# Patient Record
Sex: Female | Born: 1975 | Race: Black or African American | Hispanic: No | Marital: Single | Smoking: Never smoker
Health system: Southern US, Community
[De-identification: ages and names within clinical notes are randomized; demographics above are authoritative.]

## PROBLEM LIST (undated history)

## (undated) DIAGNOSIS — I10 Essential (primary) hypertension: Secondary | ICD-10-CM

## (undated) HISTORY — PX: TUBAL LIGATION: SHX77

---

## 2006-01-30 ENCOUNTER — Emergency Department (HOSPITAL_COMMUNITY): Admission: EM | Admit: 2006-01-30 | Discharge: 2006-01-30 | Payer: Self-pay | Admitting: Family Medicine

## 2006-03-14 ENCOUNTER — Emergency Department (HOSPITAL_COMMUNITY): Admission: EM | Admit: 2006-03-14 | Discharge: 2006-03-14 | Payer: Self-pay | Admitting: Emergency Medicine

## 2006-07-01 ENCOUNTER — Emergency Department (HOSPITAL_COMMUNITY): Admission: EM | Admit: 2006-07-01 | Discharge: 2006-07-01 | Payer: Self-pay | Admitting: Family Medicine

## 2006-12-02 ENCOUNTER — Emergency Department (HOSPITAL_COMMUNITY): Admission: EM | Admit: 2006-12-02 | Discharge: 2006-12-02 | Payer: Self-pay | Admitting: Family Medicine

## 2007-09-20 ENCOUNTER — Emergency Department (HOSPITAL_COMMUNITY): Admission: EM | Admit: 2007-09-20 | Discharge: 2007-09-20 | Payer: Self-pay | Admitting: Family Medicine

## 2008-03-14 ENCOUNTER — Emergency Department (HOSPITAL_COMMUNITY): Admission: EM | Admit: 2008-03-14 | Discharge: 2008-03-14 | Payer: Self-pay | Admitting: Family Medicine

## 2009-03-27 ENCOUNTER — Emergency Department (HOSPITAL_COMMUNITY): Admission: EM | Admit: 2009-03-27 | Discharge: 2009-03-27 | Payer: Self-pay | Admitting: Emergency Medicine

## 2010-07-10 ENCOUNTER — Emergency Department (HOSPITAL_COMMUNITY): Admission: EM | Admit: 2010-07-10 | Discharge: 2010-07-10 | Payer: Self-pay | Admitting: Family Medicine

## 2011-12-06 ENCOUNTER — Encounter (HOSPITAL_COMMUNITY): Payer: Self-pay | Admitting: *Deleted

## 2011-12-06 ENCOUNTER — Emergency Department (HOSPITAL_COMMUNITY)
Admission: EM | Admit: 2011-12-06 | Discharge: 2011-12-06 | Disposition: A | Discharge status: Home / Self Care | Payer: Self-pay | Source: Home / Self Care | Attending: Emergency Medicine | Admitting: Emergency Medicine

## 2011-12-06 DIAGNOSIS — J329 Chronic sinusitis, unspecified: Secondary | ICD-10-CM

## 2011-12-06 HISTORY — DX: Essential (primary) hypertension: I10

## 2011-12-06 MED ORDER — GUAIFENESIN ER 600 MG PO TB12: 1200.0000 mg | ORAL_TABLET | Freq: Two times a day (BID) | ORAL | Status: AC

## 2011-12-06 MED ORDER — IBUPROFEN 600 MG PO TABS: 600.0000 mg | ORAL_TABLET | Freq: Four times a day (QID) | ORAL | Status: AC | PRN

## 2011-12-06 MED ORDER — FLUTICASONE PROPIONATE 50 MCG/ACT NA SUSP: 2.0000 | Freq: Every day | NASAL | Status: AC

## 2011-12-06 MED ORDER — AMOXICILLIN-POT CLAVULANATE 875-125 MG PO TABS: 1.0000 | ORAL_TABLET | Freq: Two times a day (BID) | ORAL | Status: AC

## 2011-12-06 NOTE — ED Notes (Signed)
Pt is here with complaints of 1 week history of left sided facial pain/pressure and ear ache.  Denies additional complaints.

## 2011-12-06 NOTE — Discharge Instructions (Signed)
Take the medication as written. Return if you get worse, have a fever >100.4, or for any concerns. You may take 600 mg of motrin with 1 gram of tylenol up to 4 times a day as needed for pain. This is an effective combination for pain.  Most sinus infections are viral and do not need antibiotics unless you have a high fever, have had this for 10 day, or you get better and then get sick again. Use a neti pot or the NeilMed sinus rinse as often as you want to to reduce nasal congestion. Follow the directions on the box.  Sinusitis Sinuses are air pockets within the bones of your face. The growth of bacteria within a sinus leads to infection. The infection prevents the sinuses from draining. This infection is called sinusitis. SYMPTOMS  There will be different areas of pain depending on which sinuses have become infected.  The maxillary sinuses often produce pain beneath the eyes.   Frontal sinusitis may cause pain in the middle of the forehead and above the eyes.  Other problems (symptoms) include:  Toothaches.   Colored, pus-like (purulent) drainage from the nose.   Swelling, warmth, and tenderness over the sinus areas may be signs of infection.  TREATMENT  Sinusitis is most often determined by an exam.X-rays may be taken. If x-rays have been taken, make sure you obtain your results or find out how you are to obtain them. Your caregiver may give you medications (antibiotics). These are medications that will help kill the bacteria causing the infection. You may also be given a medication (decongestant) that helps to reduce sinus swelling.  HOME CARE INSTRUCTIONS   Only take over-the-counter or prescription medicines for pain, discomfort, or fever as directed by your caregiver.   Drink extra fluids. Fluids help thin the mucus so your sinuses can drain more easily.   Applying either moist heat or ice packs to the sinus areas may help relieve discomfort.   Use saline nasal sprays to help moisten  your sinuses. The sprays can be found at your local drugstore.  SEEK IMMEDIATE MEDICAL CARE IF:  You have a fever.   You have increasing pain, severe headaches, or toothache.   You have nausea, vomiting, or drowsiness.   You develop unusual swelling around the face or trouble seeing.  MAKE SURE YOU:   Understand these instructions.   Will watch your condition.   Will get help right away if you are not doing well or get worse.  Document Released: 10/04/2005 Document Revised: 06/16/2011 Document Reviewed: 05/03/2007 [employer] Patient Information 2012 [employer]. 

## 2011-12-06 NOTE — ED Provider Notes (Signed)
History     CSN: [id number]  Arrival date & time 12/06/11  0806   First MD Initiated Contact with Patient 12/06/11 [phone]      Chief Complaint  Patient presents with  . Facial Pain    (Consider location/radiation/quality/duration/timing/severity/associated sxs/prior treatment) HPI Comments: Pt with nasal congestion,  nonproductive cough, left frontal sinus pain/pressure worse with bending forward/lying down x 7 days, ear fullness, left ear pain. Patient states that her teeth hurt.. No fevers, ST,  N/V, other HA, bodyaches, purulent nasal d/c. Taking unknown medication w/o relief. No known sick contacts.     Patient is a 36 y.o. female presenting with sinusitis. The history is provided by the patient. No language interpreter was used.  Sinusitis  This is a new problem. The current episode started more than 1 week ago. There has been no fever. The pain has been constant since onset. Associated symptoms include ear pain and sinus pressure. Pertinent negatives include no cough. She has tried other medications for the symptoms. The treatment provided no relief.    Past Medical History  Diagnosis Date  . Hypertension     History reviewed. No pertinent past surgical history.  History reviewed. No pertinent family history.  History  Substance Use Topics  . Smoking status: Never Smoker   . Smokeless tobacco: Not on file  . Alcohol Use: Yes    OB History    Grav Para Term Preterm Abortions TAB SAB Ect Mult Living                  Review of Systems  Constitutional: Negative for fever.  HENT: Positive for ear pain and sinus pressure.   Respiratory: Negative for cough.   Gastrointestinal: Negative for nausea and vomiting.  Neurological: Positive for headaches.    Allergies  Review of patient's allergies indicates no known allergies.  Home Medications   Current Outpatient Rx  Name Route Sig Dispense Refill  . BENAZEPRIL HCL 10 MG PO TABS Oral Take 10 mg by mouth daily.      . AMOXICILLIN-POT CLAVULANATE 875-125 MG PO TABS Oral Take 1 tablet by mouth 2 (two) times daily. X 7 days 14 tablet 0  . FLUTICASONE PROPIONATE 50 MCG/ACT NA SUSP Nasal Place 2 sprays into the nose daily. 16 g 0  . GUAIFENESIN ER 600 MG PO TB12 Oral Take 2 tablets (1,200 mg total) by mouth 2 (two) times daily. 28 tablet 0  . IBUPROFEN 600 MG PO TABS Oral Take 1 tablet (600 mg total) by mouth every 6 (six) hours as needed for pain. 30 tablet 0    BP 139/104  Pulse 78  Temp(Src) 98.4 F (36.9 C) (Oral)  Resp 20  SpO2 99%  LMP 11/10/2011  Physical Exam  Nursing note and vitals reviewed. Constitutional: She is oriented to person, place, and time. She appears well-developed and well-nourished. No distress.  HENT:  Head: Normocephalic and atraumatic.  Right Ear: Tympanic membrane normal.  Left Ear: Tympanic membrane normal.  Nose: Mucosal edema and rhinorrhea present. Right sinus exhibits no maxillary sinus tenderness and no frontal sinus tenderness. Left sinus exhibits maxillary sinus tenderness. Left sinus exhibits no frontal sinus tenderness.  Mouth/Throat: Uvula is midline, oropharynx is clear and moist and mucous membranes are normal.  Eyes: Conjunctivae and EOM are normal. Pupils are equal, round, and reactive to light.  Neck: Normal range of motion.  Cardiovascular: Normal rate.   Pulmonary/Chest: Effort normal.  Abdominal: She exhibits no distension.  Musculoskeletal: Normal range  of motion.  Lymphadenopathy:    She has no cervical adenopathy.  Neurological: She is alert and oriented to person, place, and time.  Skin: Skin is warm and dry.  Psychiatric: She has a normal mood and affect. Her behavior is normal. Judgment and thought content normal.    ED Course  Procedures (including critical care time)  Labs Reviewed - No data to display No results found.   1. Sinusitis       MDM  No fevers >102, has had sx for < 10 days, no h/o double sickening. No historical or  objective evidence of bacterial infection. Will send home antibiotics, however, advise patient to wait another 3 days to fill it. Will start flonase, mucinex, increase fluids, nasal saline irrigation,  tylenol/motrin prn pain. Discussed MDM and plan with pt. Pt agrees with plan and will f/u with PMD prn.   Blood pressure noted. asxatic here. Will have patient monitor this at home.  Luiz Blare, MD 12/06/11 [phone]

## 2012-05-21 ENCOUNTER — Encounter (HOSPITAL_COMMUNITY): Payer: Self-pay | Admitting: Emergency Medicine

## 2012-05-21 ENCOUNTER — Emergency Department (HOSPITAL_COMMUNITY)
Admission: EM | Admit: 2012-05-21 | Discharge: 2012-05-21 | Disposition: A | Discharge status: Home / Self Care | Payer: Medicaid Other | Source: Home / Self Care | Attending: Emergency Medicine | Admitting: Emergency Medicine

## 2012-05-21 DIAGNOSIS — J019 Acute sinusitis, unspecified: Secondary | ICD-10-CM

## 2012-05-21 DIAGNOSIS — J309 Allergic rhinitis, unspecified: Secondary | ICD-10-CM

## 2012-05-21 MED ORDER — AMOXICILLIN-POT CLAVULANATE 875-125 MG PO TABS: 1.0000 | ORAL_TABLET | Freq: Two times a day (BID) | ORAL | Status: AC

## 2012-05-21 MED ORDER — FLUTICASONE PROPIONATE 50 MCG/ACT NA SUSP: 2.0000 | Freq: Every day | NASAL | Status: AC

## 2012-05-21 NOTE — ED Notes (Signed)
Pt has sinus pressure since 7-25

## 2012-05-21 NOTE — ED Provider Notes (Signed)
Chief Complaint  Patient presents with  . Facial Pain    History of Present Illness:   The patient is a 36 year old female who's had an 11 day history of sinus pressure worse on the left than the right, nasal congestion with clear drainage, postnasal drip, headache, sneezing, itchy nose, itchy watery eyes. She also had some postnasal drip and sore throat but denies any hoarseness. She had a slight cough productive of clear sputum. She history of allergies. She tried some over-the-counter medication without much relief. She has a history of high blood pressure.  Review of Systems:  Other than noted above, the patient denies any of the following symptoms. Systemic:  No fever, chills, sweats, fatigue, myalgias, headache, or anorexia. Eye:  No redness, pain or drainage. ENT:  No earache, ear congestion, nasal congestion, sneezing, rhinorrhea, sinus pressure, sinus pain, post nasal drip, or sore throat. Lungs:  No cough, sputum production, wheezing, shortness of breath, or chest pain. GI:  No abdominal pain, nausea, vomiting, or diarrhea. Skin:  No rash or itching.  PMFSH:  Past medical history, family history, social history, meds, and allergies were reviewed.  Physical Exam:   Vital signs:  BP 164/102  Pulse 67  Temp 98.8 F (37.1 C) (Oral)  Resp 17  SpO2 99% General:  Alert, in no distress. Eye:  No conjunctival injection or drainage. Lids were normal. ENT:  TMs and canals were normal, without erythema or inflammation.  Nasal mucosa was congested, without drainage.  Mucous membranes were moist.  Pharynx was clear, without exudate or drainage.  There were no oral ulcerations or lesions. Neck:  Supple, no adenopathy, tenderness or mass. Lungs:  No respiratory distress.  Lungs were clear to auscultation, without wheezes, rales or rhonchi.  Breath sounds were clear and equal bilaterally. Lungs were resonant to percussion.  No egophony. Heart:  Regular rhythm, without gallops, murmers or  rubs. Skin:  Clear, warm, and dry, without rash or lesions.  Assessment:  The primary encounter diagnosis was Allergic rhinitis. A diagnosis of Acute sinusitis was also pertinent to this visit.  Plan:   1.  The following meds were prescribed:   New Prescriptions   AMOXICILLIN-CLAVULANATE (AUGMENTIN) 875-125 MG PER TABLET    Take 1 tablet by mouth 2 (two) times daily.   FLUTICASONE (FLONASE) 50 MCG/ACT NASAL SPRAY    Place 2 sprays into the nose daily.   2.  The patient was instructed in symptomatic care and handouts were given. 3.  The patient was told to return if becoming worse in any way, if no better in 3 or 4 days, and given some red flag symptoms that would indicate earlier return.   Reuben Likes, MD 05/21/12 [phone]

## 2019-06-12 DIAGNOSIS — R7303 Prediabetes: Secondary | ICD-10-CM | POA: Insufficient documentation

## 2020-03-29 ENCOUNTER — Inpatient Hospital Stay (HOSPITAL_COMMUNITY): Payer: BC Managed Care – PPO

## 2020-03-29 ENCOUNTER — Emergency Department (HOSPITAL_COMMUNITY): Payer: BC Managed Care – PPO

## 2020-03-29 ENCOUNTER — Other Ambulatory Visit: Payer: Self-pay

## 2020-03-29 ENCOUNTER — Encounter (HOSPITAL_COMMUNITY): Payer: Self-pay

## 2020-03-29 ENCOUNTER — Inpatient Hospital Stay (HOSPITAL_COMMUNITY)
Admission: EM | Admit: 2020-03-29 | Discharge: 2020-03-31 | DRG: 812 | Disposition: A | Discharge status: Home / Self Care | Payer: BC Managed Care – PPO | Attending: Internal Medicine | Admitting: Internal Medicine

## 2020-03-29 DIAGNOSIS — N83201 Unspecified ovarian cyst, right side: Secondary | ICD-10-CM | POA: Diagnosis present

## 2020-03-29 DIAGNOSIS — D62 Acute posthemorrhagic anemia: Principal | ICD-10-CM | POA: Diagnosis present

## 2020-03-29 DIAGNOSIS — E871 Hypo-osmolality and hyponatremia: Secondary | ICD-10-CM | POA: Diagnosis present

## 2020-03-29 DIAGNOSIS — D509 Iron deficiency anemia, unspecified: Secondary | ICD-10-CM | POA: Diagnosis present

## 2020-03-29 DIAGNOSIS — Z20822 Contact with and (suspected) exposure to covid-19: Secondary | ICD-10-CM | POA: Diagnosis present

## 2020-03-29 DIAGNOSIS — N838 Other noninflammatory disorders of ovary, fallopian tube and broad ligament: Secondary | ICD-10-CM | POA: Diagnosis present

## 2020-03-29 DIAGNOSIS — D75839 Thrombocytosis, unspecified: Secondary | ICD-10-CM | POA: Diagnosis present

## 2020-03-29 DIAGNOSIS — I10 Essential (primary) hypertension: Secondary | ICD-10-CM | POA: Diagnosis present

## 2020-03-29 DIAGNOSIS — R195 Other fecal abnormalities: Secondary | ICD-10-CM | POA: Diagnosis present

## 2020-03-29 DIAGNOSIS — R8271 Bacteriuria: Secondary | ICD-10-CM | POA: Diagnosis present

## 2020-03-29 DIAGNOSIS — K922 Gastrointestinal hemorrhage, unspecified: Secondary | ICD-10-CM | POA: Diagnosis present

## 2020-03-29 DIAGNOSIS — D72829 Elevated white blood cell count, unspecified: Secondary | ICD-10-CM | POA: Diagnosis present

## 2020-03-29 DIAGNOSIS — R000 Tachycardia, unspecified: Secondary | ICD-10-CM | POA: Diagnosis present

## 2020-03-29 DIAGNOSIS — R1900 Intra-abdominal and pelvic swelling, mass and lump, unspecified site: Secondary | ICD-10-CM | POA: Diagnosis not present

## 2020-03-29 DIAGNOSIS — Z6836 Body mass index (BMI) 36.0-36.9, adult: Secondary | ICD-10-CM | POA: Diagnosis not present

## 2020-03-29 DIAGNOSIS — E872 Acidosis, unspecified: Secondary | ICD-10-CM | POA: Diagnosis present

## 2020-03-29 DIAGNOSIS — E878 Other disorders of electrolyte and fluid balance, not elsewhere classified: Secondary | ICD-10-CM | POA: Diagnosis present

## 2020-03-29 DIAGNOSIS — E876 Hypokalemia: Secondary | ICD-10-CM | POA: Diagnosis present

## 2020-03-29 DIAGNOSIS — R531 Weakness: Secondary | ICD-10-CM

## 2020-03-29 DIAGNOSIS — D649 Anemia, unspecified: Secondary | ICD-10-CM | POA: Diagnosis present

## 2020-03-29 DIAGNOSIS — N920 Excessive and frequent menstruation with regular cycle: Secondary | ICD-10-CM | POA: Diagnosis present

## 2020-03-29 LAB — CBC WITH DIFFERENTIAL/PLATELET
Abs Immature Granulocytes: 0.27 10*3/uL — ABNORMAL HIGH (ref 0.00–0.07)
Basophils Absolute: 0 10*3/uL (ref 0.0–0.1)
Basophils Relative: 0 %
Eosinophils Absolute: 0 10*3/uL (ref 0.0–0.5)
Eosinophils Relative: 0 %
HCT: 17.1 % — ABNORMAL LOW (ref 36.0–46.0)
Hemoglobin: 4.6 g/dL — CL (ref 12.0–15.0)
Immature Granulocytes: 1 %
Lymphocytes Relative: 10 %
Lymphs Abs: 2.7 10*3/uL (ref 0.7–4.0)
MCH: 15.6 pg — ABNORMAL LOW (ref 26.0–34.0)
MCHC: 26.9 g/dL — ABNORMAL LOW (ref 30.0–36.0)
MCV: 58.2 fL — ABNORMAL LOW (ref 80.0–100.0)
Monocytes Absolute: 1.8 10*3/uL — ABNORMAL HIGH (ref 0.1–1.0)
Monocytes Relative: 7 %
Neutro Abs: 21.2 10*3/uL — ABNORMAL HIGH (ref 1.7–7.7)
Neutrophils Relative %: 82 %
Platelets: 886 10*3/uL — ABNORMAL HIGH (ref 150–400)
RBC: 2.94 MIL/uL — ABNORMAL LOW (ref 3.87–5.11)
RDW: 22.3 % — ABNORMAL HIGH (ref 11.5–15.5)
WBC: 26 10*3/uL — ABNORMAL HIGH (ref 4.0–10.5)
nRBC: 0.5 % — ABNORMAL HIGH (ref 0.0–0.2)

## 2020-03-29 LAB — I-STAT VENOUS BLOOD GAS, ED
Acid-Base Excess: 4 mmol/L — ABNORMAL HIGH (ref 0.0–2.0)
Acid-Base Excess: 5 mmol/L — ABNORMAL HIGH (ref 0.0–2.0)
Bicarbonate: 25 mmol/L (ref 20.0–28.0)
Bicarbonate: 25.5 mmol/L (ref 20.0–28.0)
Calcium, Ion: 0.98 mmol/L — ABNORMAL LOW (ref 1.15–1.40)
Calcium, Ion: 1.03 mmol/L — ABNORMAL LOW (ref 1.15–1.40)
HCT: 17 % — ABNORMAL LOW (ref 36.0–46.0)
HCT: 19 % — ABNORMAL LOW (ref 36.0–46.0)
Hemoglobin: 5.8 g/dL — CL (ref 12.0–15.0)
Hemoglobin: 6.5 g/dL — CL (ref 12.0–15.0)
O2 Saturation: 78 %
O2 Saturation: 78 %
Potassium: 3.2 mmol/L — ABNORMAL LOW (ref 3.5–5.1)
Potassium: 3.4 mmol/L — ABNORMAL LOW (ref 3.5–5.1)
Sodium: 136 mmol/L (ref 135–145)
Sodium: 137 mmol/L (ref 135–145)
TCO2: 26 mmol/L (ref 22–32)
TCO2: 26 mmol/L (ref 22–32)
pCO2, Ven: 19.4 mmHg — CL (ref 44.0–60.0)
pCO2, Ven: 23.4 mmHg — ABNORMAL LOW (ref 44.0–60.0)
pH, Ven: 7.645 (ref 7.250–7.430)
pH, Ven: 7.72 (ref 7.250–7.430)
pO2, Ven: 30 mmHg — CL (ref 32.0–45.0)
pO2, Ven: 33 mmHg (ref 32.0–45.0)

## 2020-03-29 LAB — FERRITIN: Ferritin: 20 ng/mL (ref 11–307)

## 2020-03-29 LAB — URINALYSIS, ROUTINE W REFLEX MICROSCOPIC
Bilirubin Urine: NEGATIVE
Glucose, UA: NEGATIVE mg/dL
Ketones, ur: NEGATIVE mg/dL
Nitrite: NEGATIVE
Protein, ur: 100 mg/dL — AB
Specific Gravity, Urine: 1.02 (ref 1.005–1.030)
pH: 5.5 (ref 5.0–8.0)

## 2020-03-29 LAB — CBC
HCT: 21.9 % — ABNORMAL LOW (ref 36.0–46.0)
Hemoglobin: 6.6 g/dL — CL (ref 12.0–15.0)
MCH: 20 pg — ABNORMAL LOW (ref 26.0–34.0)
MCHC: 30.1 g/dL (ref 30.0–36.0)
MCV: 66.4 fL — ABNORMAL LOW (ref 80.0–100.0)
Platelets: 332 10*3/uL (ref 150–400)
RBC: 3.3 MIL/uL — ABNORMAL LOW (ref 3.87–5.11)
RDW: 31.5 % — ABNORMAL HIGH (ref 11.5–15.5)
WBC: 20.2 10*3/uL — ABNORMAL HIGH (ref 4.0–10.5)
nRBC: 0.5 % — ABNORMAL HIGH (ref 0.0–0.2)

## 2020-03-29 LAB — URINALYSIS, MICROSCOPIC (REFLEX)

## 2020-03-29 LAB — MAGNESIUM: Magnesium: 1.4 mg/dL — ABNORMAL LOW (ref 1.7–2.4)

## 2020-03-29 LAB — HEPATIC FUNCTION PANEL
ALT: 9 U/L (ref 0–44)
AST: 12 U/L — ABNORMAL LOW (ref 15–41)
Albumin: 1.9 g/dL — ABNORMAL LOW (ref 3.5–5.0)
Alkaline Phosphatase: 78 U/L (ref 38–126)
Bilirubin, Direct: <0.1 mg/dL (ref 0.0–0.2)
Total Bilirubin: 0.6 mg/dL (ref 0.3–1.2)
Total Protein: 7.7 g/dL (ref 6.5–8.1)

## 2020-03-29 LAB — VITAMIN B12: Vitamin B-12: 463 pg/mL (ref 180–914)

## 2020-03-29 LAB — RAPID URINE DRUG SCREEN, HOSP PERFORMED
Amphetamines: NOT DETECTED
Barbiturates: NOT DETECTED
Benzodiazepines: NOT DETECTED
Cocaine: NOT DETECTED
Opiates: NOT DETECTED
Tetrahydrocannabinol: NOT DETECTED

## 2020-03-29 LAB — IRON AND TIBC
Iron: 6 ug/dL — ABNORMAL LOW (ref 28–170)
Saturation Ratios: 3 % — ABNORMAL LOW (ref 10.4–31.8)
TIBC: 172 ug/dL — ABNORMAL LOW (ref 250–450)
UIBC: 166 ug/dL

## 2020-03-29 LAB — CBG MONITORING, ED: Glucose-Capillary: 132 mg/dL — ABNORMAL HIGH (ref 70–99)

## 2020-03-29 LAB — ABO/RH: ABO/RH(D): O POS

## 2020-03-29 LAB — RETICULOCYTES
Immature Retic Fract: 28.7 % — ABNORMAL HIGH (ref 2.3–15.9)
RBC.: 2.82 MIL/uL — ABNORMAL LOW (ref 3.87–5.11)
Retic Count, Absolute: 49.1 10*3/uL (ref 19.0–186.0)
Retic Ct Pct: 1.7 % (ref 0.4–3.1)

## 2020-03-29 LAB — PROTIME-INR
INR: 1.4 — ABNORMAL HIGH (ref 0.8–1.2)
Prothrombin Time: 16.3 seconds — ABNORMAL HIGH (ref 11.4–15.2)

## 2020-03-29 LAB — BASIC METABOLIC PANEL
Anion gap: 15 (ref 5–15)
BUN: 6 mg/dL (ref 6–20)
CO2: 24 mmol/L (ref 22–32)
Calcium: 8.2 mg/dL — ABNORMAL LOW (ref 8.9–10.3)
Chloride: 95 mmol/L — ABNORMAL LOW (ref 98–111)
Creatinine, Ser: 1 mg/dL (ref 0.44–1.00)
GFR calc Af Amer: >60 mL/min (ref >60)
GFR calc non Af Amer: >60 mL/min (ref >60)
Glucose, Bld: 183 mg/dL — ABNORMAL HIGH (ref 70–99)
Potassium: 2.9 mmol/L — ABNORMAL LOW (ref 3.5–5.1)
Sodium: 134 mmol/L — ABNORMAL LOW (ref 135–145)

## 2020-03-29 LAB — SARS CORONAVIRUS 2 BY RT PCR (HOSPITAL ORDER, PERFORMED IN ~~LOC~~ HOSPITAL LAB): SARS Coronavirus 2: NEGATIVE

## 2020-03-29 LAB — POC OCCULT BLOOD, ED: Fecal Occult Bld: POSITIVE — AB

## 2020-03-29 LAB — APTT: aPTT: 35 seconds (ref 24–36)

## 2020-03-29 LAB — LACTIC ACID, PLASMA
Lactic Acid, Venous: 2.1 mmol/L (ref 0.5–1.9)
Lactic Acid, Venous: 1.4 mmol/L (ref 0.5–1.9)

## 2020-03-29 LAB — I-STAT BETA HCG BLOOD, ED (MC, WL, AP ONLY): I-stat hCG, quantitative: <5 m[IU]/mL (ref <5)

## 2020-03-29 LAB — FOLATE: Folate: 13 ng/mL (ref >5.9)

## 2020-03-29 LAB — PREPARE RBC (CROSSMATCH)

## 2020-03-29 MED ORDER — ONDANSETRON HCL 4 MG PO TABS
4.0000 mg | ORAL_TABLET | Freq: Once | ORAL | Status: DC
  Filled 2020-03-29: qty 1

## 2020-03-29 MED ORDER — MAGNESIUM SULFATE 2 GM/50ML IV SOLN
2.0000 g | Freq: Once | INTRAVENOUS | Status: AC
  Administered 2020-03-29: 2 g via INTRAVENOUS
  Filled 2020-03-29: qty 50

## 2020-03-29 MED ORDER — PANTOPRAZOLE SODIUM 40 MG IV SOLR
40.0000 mg | Freq: Once | INTRAVENOUS | Status: AC
  Administered 2020-03-29: 40 mg via INTRAVENOUS
  Filled 2020-03-29: qty 40

## 2020-03-29 MED ORDER — POTASSIUM CHLORIDE 10 MEQ/100ML IV SOLN: 10.0000 meq | INTRAVENOUS | Status: AC

## 2020-03-29 MED ORDER — MEGESTROL ACETATE 40 MG PO TABS
40.0000 mg | ORAL_TABLET | Freq: Every day | ORAL | Status: DC
  Filled 2020-03-29 (×3): qty 1

## 2020-03-29 MED ORDER — METOPROLOL TARTRATE 12.5 MG HALF TABLET
12.5000 mg | ORAL_TABLET | Freq: Two times a day (BID) | ORAL | Status: DC
  Administered 2020-03-29 – 2020-03-31 (×4): 12.5 mg via ORAL
  Filled 2020-03-29 (×4): qty 1

## 2020-03-29 MED ORDER — POTASSIUM CHLORIDE CRYS ER 20 MEQ PO TBCR: 60.0000 meq | EXTENDED_RELEASE_TABLET | Freq: Once | ORAL | Status: DC

## 2020-03-29 MED ORDER — ACETAMINOPHEN 650 MG RE SUPP: 650.0000 mg | Freq: Four times a day (QID) | RECTAL | Status: DC | PRN

## 2020-03-29 MED ORDER — ONDANSETRON HCL 4 MG/2ML IJ SOLN: 4.0000 mg | Freq: Once | INTRAMUSCULAR | Status: DC

## 2020-03-29 MED ORDER — ONDANSETRON HCL 4 MG PO TABS: 4.0000 mg | ORAL_TABLET | Freq: Four times a day (QID) | ORAL | Status: DC | PRN

## 2020-03-29 MED ORDER — ONDANSETRON HCL 4 MG/2ML IJ SOLN: 4.0000 mg | Freq: Four times a day (QID) | INTRAMUSCULAR | Status: DC | PRN

## 2020-03-29 MED ORDER — SODIUM CHLORIDE 0.9 % IV SOLN
10.0000 mL/h | Freq: Once | INTRAVENOUS | Status: AC
  Administered 2020-03-29: 10 mL/h via INTRAVENOUS

## 2020-03-29 MED ORDER — ACETAMINOPHEN 325 MG PO TABS
650.0000 mg | ORAL_TABLET | Freq: Four times a day (QID) | ORAL | Status: DC | PRN
  Administered 2020-03-31: 650 mg via ORAL
  Filled 2020-03-29 (×2): qty 2

## 2020-03-29 MED ORDER — SODIUM CHLORIDE 0.9% FLUSH
3.0000 mL | Freq: Once | INTRAVENOUS | Status: AC
  Administered 2020-03-29: 3 mL via INTRAVENOUS

## 2020-03-29 MED ORDER — POTASSIUM CHLORIDE 20 MEQ PO PACK
40.0000 meq | PACK | Freq: Once | ORAL | Status: DC
  Filled 2020-03-29: qty 2

## 2020-03-29 MED ORDER — SODIUM CHLORIDE 0.9 % IV BOLUS
1000.0000 mL | Freq: Once | INTRAVENOUS | Status: AC
  Administered 2020-03-29: 1000 mL via INTRAVENOUS

## 2020-03-29 MED ORDER — FERROUS SULFATE 325 (65 FE) MG PO TABS
325.0000 mg | ORAL_TABLET | Freq: Every day | ORAL | Status: DC
  Filled 2020-03-29 (×2): qty 1

## 2020-03-29 NOTE — Consult Note (Signed)
Subjective:   HPI  The patient is a 44 year old female who we are asked to see in GI consultation in regards to symptomatic anemia.  She was found to have a very low hemoglobin in the emergency room and has been receiving blood transfusions.  The patient states that she has been severely anemic for at least a year.  Her primary care physician and I think gynecologist is at [facility].  The patient was told a year ago that her blood count was extremely low and that she was told that she would need iron therapy which she took periodically.  She never had a blood transfusion previously.  When I asked her if she has been bleeding or if they knew why she was anemic she states that she has been having extremely heavy menstrual periods for a year.  I asked her if she ever saw a gynecologist and she said yes but they never did anything.  She has never had a D&C.  I asked her if she had been experiencing any gastrointestinal bleeding either rectal bleeding or melena and she stated no except when she was constipated and might see a tiny bit of blood on the toilet tissue.  Otherwise her bleeding has been that of extremely heavy menstrual periods and she states these can go on for a week or sometimes a month.  She has some lower abdominal cramping discomfort associated with these.    Past Medical History:  Diagnosis Date  . Hypertension    History reviewed. No pertinent surgical history. Social History   Socioeconomic History  . Marital status: Single    Spouse name: Not on file  . Number of children: Not on file  . Years of education: Not on file  . Highest education level: Not on file  Occupational History  . Not on file  Tobacco Use  . Smoking status: Never Smoker  Substance and Sexual Activity  . Alcohol use: Yes  . Drug use: No  . Sexual activity: Not on file  Other Topics Concern  . Not on file  Social History Narrative  . Not on file   Social Determinants of Health   Financial  Resource Strain:   . Difficulty of Paying Living Expenses:   Food Insecurity:   . Worried About Charity fundraiser in the Last Year:   . Arboriculturist in the Last Year:   Transportation Needs:   . Film/video editor (Medical):   Marland Kitchen Lack of Transportation (Non-Medical):   Physical Activity:   . Days of Exercise per Week:   . Minutes of Exercise per Session:   Stress:   . Feeling of Stress :   Social Connections:   . Frequency of Communication with Friends and Family:   . Frequency of Social Gatherings with Friends and Family:   . Attends Religious Services:   . Active Member of Clubs or Organizations:   . Attends Archivist Meetings:   Marland Kitchen Marital Status:   Intimate Partner Violence:   . Fear of Current or Ex-Partner:   . Emotionally Abused:   Marland Kitchen Physically Abused:   . Sexually Abused:    family history is not on file.  Current Facility-Administered Medications:  .  acetaminophen (TYLENOL) tablet 650 mg, 650 mg, Oral, Q6H PRN **OR** acetaminophen (TYLENOL) suppository 650 mg, 650 mg, Rectal, Q6H PRN, Pahwani, Rinka R, MD .  magnesium sulfate IVPB 2 g 50 mL, 2 g, Intravenous, Once, Pahwani, Rinka R, MD .  ondansetron (ZOFRAN) tablet 4 mg, 4 mg, Oral, Q6H PRN **OR** ondansetron (ZOFRAN) injection 4 mg, 4 mg, Intravenous, Q6H PRN, Pahwani, Rinka R, MD .  ondansetron (ZOFRAN) tablet 4 mg, 4 mg, Oral, Once, Venter, Margaux, PA-C .  pantoprazole (PROTONIX) injection 40 mg, 40 mg, Intravenous, Once, Venter, Margaux, PA-C .  potassium chloride (KLOR-CON) packet 40 mEq, 40 mEq, Oral, Once, Pahwani, Rinka R, MD  Current Outpatient Medications:  .  ferrous sulfate 325 (65 FE) MG tablet, Take 325 mg by mouth daily with breakfast., Disp: , Rfl:  .  metoprolol tartrate (LOPRESSOR) 25 MG tablet, Take 12.5 mg by mouth in the morning and at bedtime., Disp: , Rfl:  .  fluticasone (FLONASE) 50 MCG/ACT nasal spray, Place 2 sprays into the nose daily., Disp: 16 g, Rfl: 0 .  fluticasone  (FLONASE) 50 MCG/ACT nasal spray, Place 2 sprays into the nose daily., Disp: 16 g, Rfl: 0 No Known Allergies   Objective:     BP (!) 156/79   Pulse (!) 133   Temp 98.9 F (37.2 C) (Oral)   Resp (!) 26   SpO2 100%   No distress  Heart regular rhythm no murmurs  Lungs clear  Abdomen soft and nontender  Laboratory No components found for: D1    Assessment:     Symptomatic anemia  Heavy menstrual periods      Plan:     I think the main reason for her severe anemia that she has known about for a year is of gynecological origin.  I would recommend obtaining the records from [facility] and seeing what they thought and what they have done.  I would recommend getting a GYN consult here.  I am not inclined to start her anemia evaluation with a GI work-up with the obvious history that is currently available.  Further GI work-up could be done as an outpatient.  Agree with blood transfusions. Lab Results  Component Value Date   HGB 6.5 (LL) 03/29/2020   HGB 5.8 (LL) 03/29/2020   HGB 4.6 (LL) 03/29/2020   HCT 19.0 (L) 03/29/2020   HCT 17.0 (L) 03/29/2020   HCT 17.1 (L) 03/29/2020   ALKPHOS 78 03/29/2020   AST 12 (L) 03/29/2020   ALT 9 03/29/2020

## 2020-03-29 NOTE — ED Triage Notes (Signed)
Pt arrives to ED w/ c/o weakness. States she had blood work down w/ PCP and absolute neutrophils and monocytes were high. Pt also states her iron is low. Pt tachy in triage.

## 2020-03-29 NOTE — H&P (Signed)
History and Physical    TYSON MASIN XTG:[id number] DOB: [birth year] DOA: 03/29/2020  PCP: No primary care provider on file.  Patient coming from: Home I have personally briefly reviewed patient's old medical records in [place]  Chief Complaint: Low hemoglobin  HPI: Tracey Shaw is a 44 y.o. female with medical history significant of hypertension, iron deficiency anemia presents to emergency department for the concern of low hemoglobin.  Patient tells me that she had routine labs done yesterday and she was called this morning by her PCP that her hemoglobin is low and advised to go to the emergency department for further evaluation and management.  Patient tells me that she has history of iron deficiency anemia and semicompliant with iron supplements.  Her stool is always black due to iron pills.  She denies headache, blurry vision, lightheadedness, dizziness, chest pain, shortness of breath, weakness, lethargic, decreased appetite, hematemesis, history of colon cancer, over-the-counter use of NSAIDs, she is not on any blood thinner, dysuria, hematuria, change in bowel habits.  She tells me that she has lower abdominal pain associated with vomiting since 2 days.  She vomited about couple of times, nonbloody.  Reports that she has history of heavy menstrual cycle which improves when she takes iron supplements.  Her LMP: 02/29/2020 which lasted for 6 days.  She tells me that she has heavy bleeding for initial 2 days.  She uses about 7-8 pads per day.  No history of STDs, currently she is not sexually active.  Denies use of smoking alcohol or illicit drug.  Denies history of sickle cell disease, thalassemia, bone marrow disorder in past or in family.  ED Course: Upon arrival to ED: Patient tachycardic, tachypneic, blood pressure in 150s over 80s, H&H: 4.6/17.1, WBC: 26, MCV: 58.2, platelet: 886, sodium of 134, potassium of 2.9, chloride of 95, COVID-19 negative, B12 and folate:  WNL, lactic acid: 2.1, magnesium: 1.4, iron studies shows low iron, TIBC and ferritin.  Chest x-ray negative.  Patient received 2 unit of PRBC in ED.  EDP consulted GI.  [facility] hospitalist consulted for admission for symptomatic anemia.  Review of Systems: As per HPI otherwise negative.    Past Medical History:  Diagnosis Date  . Hypertension     History reviewed. No pertinent surgical history.   reports that she has never smoked. She does not have any smokeless tobacco history on file. She reports current alcohol use. She reports that she does not use drugs.  No Known Allergies  No family history on file.  Prior to Admission medications   Medication Sig Start Date End Date Taking? Authorizing Provider  ferrous sulfate 325 (65 FE) MG tablet Take 325 mg by mouth daily with breakfast.   Yes [provider]  metoprolol tartrate (LOPRESSOR) 25 MG tablet Take 12.5 mg by mouth in the morning and at bedtime. 12/24/19  Yes [provider]  fluticasone (FLONASE) 50 MCG/ACT nasal spray Place 2 sprays into the nose daily. 12/06/11 12/05/12  Melynda Ripple, MD  fluticasone (FLONASE) 50 MCG/ACT nasal spray Place 2 sprays into the nose daily. 05/21/12 05/21/13  Harden Mo, MD    Physical Exam: Vitals:   03/29/20 1615 03/29/20 1647 03/29/20 1700 03/29/20 1701  BP: (!) 153/87 (!) 148/81  (!) 156/79  Pulse: (!) 133 (!) 130 86 (!) 133  Resp: (!) 23 (!) 22 (!) 27 (!) 26  Temp: 99.4 F (37.4 C) 98.6 F (37 C) 98.9 F (37.2 C) 98.9 F (37.2  C)  TempSrc: Oral Oral Oral Oral  SpO2: 100%  100% 100%    Constitutional: NAD, calm, communicating well Eyes: PERRL, lids and conjunctivae: Pale ENMT: Mucous membranes are moist. Posterior pharynx clear of any exudate or lesions.Normal dentition.  Neck: normal, supple, no masses, no thyromegaly Respiratory: clear to auscultation bilaterally, no wheezing, no crackles. Normal respiratory effort. No accessory muscle use.  Cardiovascular:   tachycardic, no murmurs / rubs / gallops. No extremity edema. 2+ pedal pulses. No carotid bruits.  Abdomen: Lower abdominal tenderness positive, no guarding, no rigidity, no masses palpated. No hepatosplenomegaly. Bowel sounds positive.  Musculoskeletal: no clubbing / cyanosis. No joint deformity upper and lower extremities. Good ROM, no contractures. Normal muscle tone.  Skin: no rashes, lesions, ulcers. No induration Neurologic: CN 2-12 grossly intact. Sensation intact, DTR normal. Strength 5/5 in all 4.  Psychiatric: Normal judgment and insight. Alert and oriented x 3. Normal mood.    Labs on Admission: I have personally reviewed following labs and imaging studies  CBC: Recent Labs  Lab 03/29/20 1222 03/29/20 1349 03/29/20 1418  WBC 26.0*  --   --   NEUTROABS 21.2*  --   --   HGB 4.6* 5.8* 6.5*  HCT 17.1* 17.0* 19.0*  MCV 58.2*  --   --   PLT 886*  --   --    Basic Metabolic Panel: Recent Labs  Lab 03/29/20 1222 03/29/20 1338 03/29/20 1349 03/29/20 1418  NA 134*  --  136 137  K 2.9*  --  3.2* 3.4*  CL 95*  --   --   --   CO2 24  --   --   --   GLUCOSE 183*  --   --   --   BUN 6  --   --   --   CREATININE 1.00  --   --   --   CALCIUM 8.2*  --   --   --   MG  --  1.4*  --   --    GFR: CrCl cannot be calculated (Unknown ideal weight.). Liver Function Tests: Recent Labs  Lab 03/29/20 1338  AST 12*  ALT 9  ALKPHOS 78  BILITOT 0.6  PROT 7.7  ALBUMIN 1.9*   No results for input(s): LIPASE, AMYLASE in the last 168 hours. No results for input(s): AMMONIA in the last 168 hours. Coagulation Profile: Recent Labs  Lab 03/29/20 1338  INR 1.4*   Cardiac Enzymes: No results for input(s): CKTOTAL, CKMB, CKMBINDEX, TROPONINI in the last 168 hours. BNP (last 3 results) No results for input(s): PROBNP in the last 8760 hours. HbA1C: No results for input(s): HGBA1C in the last 72 hours. CBG: Recent Labs  Lab 03/29/20 1528  GLUCAP 132*   Lipid Profile: No results  for input(s): CHOL, HDL, LDLCALC, TRIG, CHOLHDL, LDLDIRECT in the last 72 hours. Thyroid Function Tests: No results for input(s): TSH, T4TOTAL, FREET4, T3FREE, THYROIDAB in the last 72 hours. Anemia Panel: Recent Labs    03/29/20 1338  VITAMINB12 463  FOLATE 13.0  FERRITIN 20  TIBC 172*  IRON 6*  RETICCTPCT 1.7   Urine analysis:    Component Value Date/Time   COLORURINE YELLOW 03/29/2020 1235   APPEARANCEUR HAZY (A) 03/29/2020 1235   LABSPEC 1.020 03/29/2020 1235   PHURINE 5.5 03/29/2020 1235   GLUCOSEU NEGATIVE 03/29/2020 1235   HGBUR SMALL (A) 03/29/2020 1235   BILIRUBINUR NEGATIVE 03/29/2020 1235   KETONESUR NEGATIVE 03/29/2020 1235   PROTEINUR 100 (A)  03/29/2020 1235   NITRITE NEGATIVE 03/29/2020 1235   LEUKOCYTESUR TRACE (A) 03/29/2020 1235    Radiological Exams on Admission: DG Chest Port 1 View  Result Date: 03/29/2020 CLINICAL DATA:  Shortness of breath.  Weakness. EXAM: PORTABLE CHEST 1 VIEW COMPARISON:  None. FINDINGS: Lung volumes are low.The cardiomediastinal contours are normal. The lungs are clear. Pulmonary vasculature is normal. No consolidation, pleural effusion, or pneumothorax. No acute osseous abnormalities are seen. Scoliotic curvature of the spine versus positioning. IMPRESSION: Low lung volumes without acute abnormality. Electronically Signed   By: Narda Rutherford M.D.   On: 03/29/2020 15:06    EKG: Independently reviewed.  Sinus tachycardia, no ST elevation or depression noted.  Assessment/Plan Principal Problem:   Acute blood loss anemia Active Problems:   Hypertension   Symptomatic anemia   GI bleed   Hypomagnesemia   Hyponatremia   Hypokalemia   Hypochloremia   Leukocytosis   Lactic acid acidosis   Thrombocytosis (HCC)    Acute blood loss anemia/symptomatic anemia: -Patient's hemoglobin noted to be 4.6/17.1.  POC occult blood positive.  Patient tachycardic, tachypneic upon arrival.  She is afebrile.  Has leukocytosis of 26,000.   Lactic acid of 2.1.  COVID-19 negative.  Chest x-ray negative. -Received 2 unit of PRBC in the ED. -Admit patient to stepdown unit for close monitoring.  Monitor vitals closely.  Monitor H&H closely. -EDP consulted GI -Patient has history of menorrhagia-we will get pelvic ultrasound to rule out uterine fibroid disease -Zofran as needed for nausea and vomiting -Tylenol/Dilaudid as needed for pain control  Hypertension: Blood pressure elevated -Continue metoprolol.  Hypomagnesemia: Replenished -Repeat magnesium tomorrow a.m.  Hyponatremia/hypokalemia/hypochloremia: -Replenished.  Repeat CMP tomorrow a.m.  Severe iron deficiency anemia: -H&H: 4.6/17.1, reviewed iron studies.  MCV: 58.2 -Continue iron supplement  Asymptomatic bacteriuria: Patient denies any urinary symptoms -Will not start on antibiotics at this time.  Thrombocytosis: Platelet: 886 -Likely reactive -Repeat CBC tomorrow a.m.  Leukocytosis: White count of 26,000 -Unknown etiology.  Patient is afebrile.  Chest x-ray is negative. -We will repeat CBC.  Will hold off of antibiotics at this time.  Please note: Got a call from radiology that patient has giant right ovarian cyst measuring about 10 cm in size concerning for variant torsion.  I called OB/GYN on-call Dr. Shawnie Pons and discussed the result.  She will come and assess the patient.  DVT prophylaxis: SCD/TED Code Status: Full code Family Communication: Patient's sister present at bedside.  Plan of care discussed with patient and her sister at bedside in length and they verbalized understanding and agreed with it. Disposition Plan: Likely home in 2 days Consults called: GI by EDP  admission status: Inpatient   Ollen Bowl MD [facility]  If 7PM-7AM, please contact night-coverage [email or web address] Password [facility]  03/29/2020, 5:08 PM

## 2020-03-29 NOTE — Progress Notes (Addendum)
Patient ID: Tracey Shaw, female   DOB: 07-31-1976, 44 y.o.   MRN: [id number]   Impression: Principal Problem:   Acute blood loss anemia Active Problems:   Hypertension   Symptomatic anemia   GI bleed   Hypomagnesemia   Hyponatremia   Hypokalemia   Hypochloremia   Leukocytosis   Lactic acid acidosis   Thrombocytosis (HCC)   Ovarian mass, right   Menorrhagia   Morbid obesity (HCC)   Recommendations: 1.  Pelvic mass - may be a fibroid or ovarian in nature.  The patient has no acute abdominal pain and has a very benign exam.  Given all of this seriously doubt ovarian torsion and need for acute intervention. Will check Ca125 and consider MRI for further delineation of this pelvic mass. 2. Menorrhagia and long-term anemia and other abnormalities on her CBC which is not new.  Agree with acute blood transfusion, consider IV iron.  It does not appear that she seen a gynecologist since 2014.  Given her age, her ongoing blood loss, her pelvic mass, and the fact that she no longer desires fertility, hysterectomy may be an option for her. This would be done after improving her blood counts and would be scheduled from an outpatient setting.  Would consider updating her Pap, mammogram, and possible endometrial biopsy prior to scheduling surgery.  We also discussed endometrial ablation and IUD as alternatives.  I have started Megace 40 mg twice daily in case she begins to have vaginal bleeding again.  Her cycle is due in the next couple of days. 3. Check TSH 4. Could consider hematology consultation given ongoing leukocytosis and thrombocytosis.  Reason for consult: Patient is a 44 y.o. G37P0101 female who was admitted with acute blood loss anemia.  We are asked to see the patient regarding this anemia, menorrhagia, findings on pelvic ultrasound of a 10 cm presumed right adnexal mass with no blood flow.  The patient reports her last normal menstrual period on 5/14, it lasted approximately 6 days  and was heavy on the first 2 days.  Her cycles have been more regular lately.  She reports going through about 7 pads a day on the first 2 days of her cycles.  She has a long history of anemia and poor compliance with her iron repletion.  She last saw a gynecologist in 2014 who thought she had dysfunctional uterine bleeding as a function of her obesity.  She does have some history of bleeding times approximately 4 weeks at some point in the past though she is very unclear on how long ago this might have been.  She thinks her last cycles have all been regular.  She denies any significant abdominal pain.  She has some pressure noted that she assumes is related to upcoming menses which is due in the next day or so. After being admitted to the hospitalist service of having GI consult for heme positive stool, a pelvic sonogram was done and found to 10 cm right adnexal mass without flow and concern for torsion.  We are asked to see her emergently to rule out torsion.  The patient came to the ER today at the behest of her primary care physician who told her to come in due to a low hemoglobin.  She had no acute abdominal pain and reports no significant pain right now either.  Past Medical History:  Diagnosis Date  . Hypertension     Past Surgical History:  Procedure Laterality Date  . TUBAL LIGATION  Family History  Problem Relation Age of Onset  . Diabetes Father   . Hypertension Mother   . Breast cancer Sister   . Hypertension Sister     Social History   Socioeconomic History  . Marital status: Single    Spouse name: Not on file  . Number of children: Not on file  . Years of education: Not on file  . Highest education level: Not on file  Occupational History  . Not on file  Tobacco Use  . Smoking status: Never Smoker  Substance and Sexual Activity  . Alcohol use: Yes  . Drug use: No  . Sexual activity: Not on file  Other Topics Concern  . Not on file  Social History Narrative  .  Not on file   Social Determinants of Health   Financial Resource Strain:   . Difficulty of Paying Living Expenses:   Food Insecurity:   . Worried About Programme researcher, broadcasting/film/video in the Last Year:   . Barista in the Last Year:   Transportation Needs:   . Freight forwarder (Medical):   Marland Kitchen Lack of Transportation (Non-Medical):   Physical Activity:   . Days of Exercise per Week:   . Minutes of Exercise per Session:   Stress:   . Feeling of Stress :   Social Connections:   . Frequency of Communication with Friends and Family:   . Frequency of Social Gatherings with Friends and Family:   . Attends Religious Services:   . Active Member of Clubs or Organizations:   . Attends Banker Meetings:   Marland Kitchen Marital Status:   Intimate Partner Violence:   . Fear of Current or Ex-Partner:   . Emotionally Abused:   Marland Kitchen Physically Abused:   . Sexually Abused:     . [START ON 03/30/2020] ferrous sulfate  325 mg Oral Q breakfast  . megestrol  40 mg Oral Daily  . metoprolol tartrate  12.5 mg Oral BID  . ondansetron  4 mg Oral Once  . potassium chloride  40 mEq Oral Once   OB History  Gravida Para Term Preterm AB Living  1 1   1   1   SAB TAB Ectopic Multiple Live Births          1    # Outcome Date GA Lbr Len/2nd Weight Sex Delivery Anes PTL Lv  1 Preterm  [redacted]w[redacted]d   F Vag-Spont   LIV    No Known Allergies  Review of Systems - Negative except Fatigue, weakness specifically denies shortness of breath, fever, chills, chest pain, blood in her stool, diarrhea, nausea, vomiting, abdominal pain  Exam Vitals:   03/29/20 1847 03/29/20 1930  BP: (!) 152/66 (!) 114/59  Pulse: (!) 131 (!) 125  Resp: (!) 23 (!) 24  Temp:    SpO2: 100% 100%    Physical Examination: General appearance - alert, ill appearing, and in no distress, she is morbidly obese, she is pale Neck - supple, no significant adenopathy Chest -normal effort Heart - normal rate, regular rhythm, normal S1, S2, no  murmurs, rubs, clicks or gallops, normal rate and regular rhythm Abdomen - soft, nontender, nondistended, no masses or organomegaly, there is no rebound Neurological - alert, oriented, normal speech, no focal findings or movement disorder noted Extremities - peripheral pulses normal, no pedal edema, no clubbing or cyanosis Skin - normal coloration and turgor, no rashes, no suspicious skin lesions noted  Labs:  CBC  Component Value Date/Time   WBC 26.0 (H) 03/29/2020 1222   RBC 2.82 (L) 03/29/2020 1338   RBC 2.94 (L) 03/29/2020 1222   HGB 6.5 (LL) 03/29/2020 1418   HCT 19.0 (L) 03/29/2020 1418   PLT 886 (H) 03/29/2020 1222   MCV 58.2 (L) 03/29/2020 1222   MCH 15.6 (L) 03/29/2020 1222   MCHC 26.9 (L) 03/29/2020 1222   RDW 22.3 (H) 03/29/2020 1222   LYMPHSABS 2.7 03/29/2020 1222   MONOABS 1.8 (H) 03/29/2020 1222   EOSABS 0.0 03/29/2020 1222   BASOSABS 0.0 03/29/2020 1222    CMP     Component Value Date/Time   NA 137 03/29/2020 1418   K 3.4 (L) 03/29/2020 1418   CL 95 (L) 03/29/2020 1222   CO2 24 03/29/2020 1222   GLUCOSE 183 (H) 03/29/2020 1222   BUN 6 03/29/2020 1222   CREATININE 1.00 03/29/2020 1222   CALCIUM 8.2 (L) 03/29/2020 1222   PROT 7.7 03/29/2020 1338   ALBUMIN 1.9 (L) 03/29/2020 1338   AST 12 (L) 03/29/2020 1338   ALT 9 03/29/2020 1338   ALKPHOS 78 03/29/2020 1338   BILITOT 0.6 03/29/2020 1338   GFRNONAA >60 03/29/2020 1222   GFRAA >60 03/29/2020 1222     Radiological Studies I independently reviewed images from the pelvic ultrasound DG Chest Port 1 View  Result Date: 03/29/2020 CLINICAL DATA:  Shortness of breath.  Weakness. EXAM: PORTABLE CHEST 1 VIEW COMPARISON:  None. FINDINGS: Lung volumes are low.The cardiomediastinal contours are normal. The lungs are clear. Pulmonary vasculature is normal. No consolidation, pleural effusion, or pneumothorax. No acute osseous abnormalities are seen. Scoliotic curvature of the spine versus positioning. IMPRESSION:  Low lung volumes without acute abnormality. Electronically Signed   By: Narda Rutherford M.D.   On: 03/29/2020 15:06   US PELVIC COMPLETE WITH TRANSVAGINAL  Result Date: 03/29/2020 CLINICAL DATA:  Menorrhagia EXAM: TRANSABDOMINAL ULTRASOUND OF PELVIS DOPPLER ULTRASOUND OF OVARIES TECHNIQUE: Transabdominal ultrasound examination of the pelvis was performed including evaluation of the uterus, ovaries, adnexal regions, and pelvic cul-de-sac. Color and duplex Doppler ultrasound was utilized to evaluate blood flow to the ovaries. COMPARISON:  None. FINDINGS: Uterus Measurements: 9.4 x 5.1 x 3.0 cm = volume: 177 mL. No fibroids or other mass visualized. Endometrium Thickness: 4 mm.  No focal abnormality visualized. Right ovary Measurements: 10.0 x 7.2 x 6.6 cm = volume: 250 mL. The right ovary is grossly enlarged by a complex cyst or mass, with heterogeneous internal echogenicity. There is no visualized color Doppler flow the right ovary. Left ovary The left ovary is nonvisualized. Other: Small volume right adnexal free fluid. IMPRESSION: 1. The right ovary is grossly enlarged by a complex cyst or mass, heterogeneous internal echogenicity, measuring at least 10.0 cm. This may reflect a large hemorrhagic cyst or perhaps an ovarian teratoma. There is no visualized color Doppler flow to the right ovary, concerning for ovarian torsion. Any ovarian mass may serve as a nidus of torsion. 2.  The left ovary is nonvisualized. 3.  Small volume nonspecific right adnexal fluid. These results were called by telephone at the time of interpretation on 03/29/2020 at 7:12 pm to Dr. Ardean Larsen , who verbally acknowledged these results. Electronically Signed   By: Lauralyn Primes M.D.   On: 03/29/2020 19:12    Thank you so much for allowing [place] to participate in the care of this patient.  We will continue to follow with you. Please call the attending OB/GYN physician with questions or concerns  at [phone] M-F 8a-5p, after hours  and on weekend, we can be reached at [phone].

## 2020-03-29 NOTE — ED Provider Notes (Addendum)
[facility] EMERGENCY DEPARTMENT Provider Note   CSN: [id number] Arrival date & time: 03/29/20  1201     History Chief Complaint  Patient presents with  . Weakness    Tracey Shaw is a 44 y.o. female with PMHx HTN who presents to the ED today with complaint of abnormal lab as well as generalized weakness that began yesterday.  Patient she reports history of iron deficiency anemia for which she takes iron supplementation for.  She states she normally gets her iron and hemoglobin levels checked monthly, had labs checked yesterday and received an email today regarding a hemoglobin level of 4.6.  She was strongly encouraged to come to the ED for further evaluation.  She reports that she takes her iron supplementation sporadically, states that it will give her some abdominal pain and so she does not take it daily as she is supposed to.  She does mention similar hemoglobin levels last year sometime in September or October and was told to come to the ED however due to "Covid" she was too scared and did not come.  She states she felt generalized weakness yesterday but did not think much of it.   Patient denies heavy NSAID use.  Denies alcohol use.  Denies drug use.  Patient is a non-smoker.  Denies any bright red blood per rectum or melena.   The history is provided by the patient and medical records.       Past Medical History:  Diagnosis Date  . Hypertension     Patient Active Problem List   Diagnosis Date Noted  . Hypertension   . Symptomatic anemia   . GI bleed   . Hypomagnesemia   . Hyponatremia   . Hypokalemia   . Hypochloremia   . Leukocytosis   . Lactic acid acidosis   . Thrombocytosis (HCC)   . Acute blood loss anemia     History reviewed. No pertinent surgical history.   OB History   No obstetric history on file.     No family history on file.  Social History   Tobacco Use  . Smoking status: Never Smoker  Substance Use Topics  .  Alcohol use: Yes  . Drug use: No    Home Medications Prior to Admission medications   Medication Sig Start Date End Date Taking? Authorizing Provider  ferrous sulfate 325 (65 FE) MG tablet Take 325 mg by mouth daily with breakfast.   Yes [provider]  metoprolol tartrate (LOPRESSOR) 25 MG tablet Take 12.5 mg by mouth in the morning and at bedtime. 12/24/19  Yes [provider]  fluticasone (FLONASE) 50 MCG/ACT nasal spray Place 2 sprays into the nose daily. 12/06/11 12/05/12  Domenick Gong, MD  fluticasone (FLONASE) 50 MCG/ACT nasal spray Place 2 sprays into the nose daily. 05/21/12 05/21/13  Reuben Likes, MD    Allergies    Patient has no known allergies.  Review of Systems   Review of Systems  Constitutional: Positive for fatigue. Negative for chills and fever.  Respiratory: Negative for shortness of breath.   Cardiovascular: Negative for chest pain.  Gastrointestinal: Negative for blood in stool, diarrhea, nausea and vomiting.  All other systems reviewed and are negative.   Physical Exam Updated Vital Signs BP 138/89   Pulse (!) 140   Temp 98.5 F (36.9 C) (Oral)   Resp (!) 22   SpO2 100%   Physical Exam Vitals and nursing note reviewed.  Constitutional:  Appearance: She is obese. She is not ill-appearing or diaphoretic.     Comments: Appears anxious  HENT:     Head: Normocephalic and atraumatic.  Eyes:     Conjunctiva/sclera: Conjunctivae normal.  Cardiovascular:     Rate and Rhythm: Regular rhythm. Tachycardia present.     Pulses: Normal pulses.     Comments: Tachycardic in the 160s Pulmonary:     Effort: Pulmonary effort is normal.     Breath sounds: Normal breath sounds. No wheezing, rhonchi or rales.  Abdominal:     Palpations: Abdomen is soft.     Tenderness: There is no abdominal tenderness. There is no guarding or rebound.  Genitourinary:    Rectum: Guaiac result positive.     Comments: Chaperone present for GU exam.  No  external hemorrhoid appreciated.  Obvious blood on gloved finger, guaiac positive. Musculoskeletal:     Cervical back: Neck supple.  Skin:    General: Skin is warm and dry.  Neurological:     Mental Status: She is alert.     ED Results / Procedures / Treatments   Labs (all labs ordered are listed, but only abnormal results are displayed) Labs Reviewed  URINALYSIS, ROUTINE W REFLEX MICROSCOPIC - Abnormal; Notable for the following components:      Result Value   APPearance HAZY (*)    Hgb urine dipstick SMALL (*)    Protein, ur 100 (*)    Leukocytes,Ua TRACE (*)    All other components within normal limits  CBC WITH DIFFERENTIAL/PLATELET - Abnormal; Notable for the following components:   WBC 26.0 (*)    RBC 2.94 (*)    Hemoglobin 4.6 (*)    HCT 17.1 (*)    MCV 58.2 (*)    MCH 15.6 (*)    MCHC 26.9 (*)    RDW 22.3 (*)    Platelets 886 (*)    nRBC 0.5 (*)    Neutro Abs 21.2 (*)    Monocytes Absolute 1.8 (*)    Abs Immature Granulocytes 0.27 (*)    All other components within normal limits  BASIC METABOLIC PANEL - Abnormal; Notable for the following components:   Sodium 134 (*)    Potassium 2.9 (*)    Chloride 95 (*)    Glucose, Bld 183 (*)    Calcium 8.2 (*)    All other components within normal limits  URINALYSIS, MICROSCOPIC (REFLEX) - Abnormal; Notable for the following components:   Bacteria, UA RARE (*)    All other components within normal limits  IRON AND TIBC - Abnormal; Notable for the following components:   Iron 6 (*)    TIBC 172 (*)    Saturation Ratios 3 (*)    All other components within normal limits  RETICULOCYTES - Abnormal; Notable for the following components:   RBC. 2.82 (*)    Immature Retic Fract 28.7 (*)    All other components within normal limits  LACTIC ACID, PLASMA - Abnormal; Notable for the following components:   Lactic Acid, Venous 2.1 (*)    All other components within normal limits  MAGNESIUM - Abnormal; Notable for the  following components:   Magnesium 1.4 (*)    All other components within normal limits  HEPATIC FUNCTION PANEL - Abnormal; Notable for the following components:   Albumin 1.9 (*)    AST 12 (*)    All other components within normal limits  PROTIME-INR - Abnormal; Notable for the following components:   Prothrombin  Time 16.3 (*)    INR 1.4 (*)    All other components within normal limits  CBG MONITORING, ED - Abnormal; Notable for the following components:   Glucose-Capillary 132 (*)    All other components within normal limits  POC OCCULT BLOOD, ED - Abnormal; Notable for the following components:   Fecal Occult Bld POSITIVE (*)    All other components within normal limits  I-STAT VENOUS BLOOD GAS, ED - Abnormal; Notable for the following components:   pH, Ven 7.720 (*)    pCO2, Ven 19.4 (*)    pO2, Ven 30.0 (*)    Acid-Base Excess 5.0 (*)    Potassium 3.2 (*)    Calcium, Ion 0.98 (*)    HCT 17.0 (*)    Hemoglobin 5.8 (*)    All other components within normal limits  I-STAT VENOUS BLOOD GAS, ED - Abnormal; Notable for the following components:   pH, Ven 7.645 (*)    pCO2, Ven 23.4 (*)    Acid-Base Excess 4.0 (*)    Potassium 3.4 (*)    Calcium, Ion 1.03 (*)    HCT 19.0 (*)    Hemoglobin 6.5 (*)    All other components within normal limits  SARS CORONAVIRUS 2 BY RT PCR (HOSPITAL ORDER, [place] [facility])  VITAMIN B12  FOLATE  FERRITIN  APTT  LACTIC ACID, PLASMA  RAPID URINE DRUG SCREEN, HOSP PERFORMED  TSH  HIV ANTIBODY (ROUTINE TESTING W REFLEX)  I-STAT BETA HCG BLOOD, ED ([facility], [facility], [facility] ONLY)  TYPE AND SCREEN  PREPARE RBC (CROSSMATCH)  ABO/RH    EKG EKG Interpretation  Date/Time:  Saturday March 29 2020 12:10:08 EDT Ventricular Rate:  169 PR Interval:  88 QRS Duration: 86 QT Interval:  274 QTC Calculation: 459 R Axis:   45 Text Interpretation: Sinus tachycardia with short PR Nonspecific ST and T wave abnormality Abnormal ECG profound  tachycardia Confirmed by Varney Biles ([id number]) on 03/29/2020 12:55:23 PM   Radiology DG Chest Port 1 View  Result Date: 03/29/2020 CLINICAL DATA:  Shortness of breath.  Weakness. EXAM: PORTABLE CHEST 1 VIEW COMPARISON:  None. FINDINGS: Lung volumes are low.The cardiomediastinal contours are normal. The lungs are clear. Pulmonary vasculature is normal. No consolidation, pleural effusion, or pneumothorax. No acute osseous abnormalities are seen. Scoliotic curvature of the spine versus positioning. IMPRESSION: Low lung volumes without acute abnormality. Electronically Signed   By: Keith Rake M.D.   On: 03/29/2020 15:06    Procedures .Critical Care Performed by: Eustaquio Maize, PA-C Authorized by: Eustaquio Maize, PA-C   Critical care provider statement:    Critical care time (minutes):  45   Critical care was necessary to treat or prevent imminent or life-threatening deterioration of the following conditions:  Circulatory failure   Critical care was time spent personally by me on the following activities:  Discussions with consultants, evaluation of patient's response to treatment, examination of patient, ordering and performing treatments and interventions, ordering and review of laboratory studies, ordering and review of radiographic studies, pulse oximetry, re-evaluation of patient's condition, obtaining history from patient or surrogate and review of old charts   (including critical care time)  Medications Ordered in ED Medications  pantoprazole (PROTONIX) injection 40 mg (has no administration in time range)  potassium chloride 10 mEq in 100 mL IVPB (10 mEq Intravenous Not Given 03/29/20 1534)  ondansetron (ZOFRAN) tablet 4 mg (has no administration in time range)  acetaminophen (TYLENOL) tablet 650 mg (has no  administration in time range)    Or  acetaminophen (TYLENOL) suppository 650 mg (has no administration in time range)  ondansetron (ZOFRAN) tablet 4 mg (has no  administration in time range)    Or  ondansetron (ZOFRAN) injection 4 mg (has no administration in time range)  magnesium sulfate IVPB 2 g 50 mL (has no administration in time range)  potassium chloride (KLOR-CON) packet 40 mEq (has no administration in time range)  sodium chloride flush (NS) 0.9 % injection 3 mL (3 mLs Intravenous Given 03/29/20 1253)  sodium chloride 0.9 % bolus 1,000 mL (1,000 mLs Intravenous New Bag/Given 03/29/20 1306)  0.9 %  sodium chloride infusion (10 mL/hr Intravenous New Bag/Given 03/29/20 1424)    ED Course  I have reviewed the triage vital signs and the nursing notes.  Pertinent labs & imaging results that were available during my care of the patient were reviewed by me and considered in my medical decision making (see chart for details).  Clinical Course as of Mar 29 1642  Sat Mar 29, 2020  1248 Pulse Rate(!): 162 [MV]  1253 Fecal Occult Blood, POC(!): POSITIVE [MV]    Clinical Course User Index [MV] Tanda Rockers, PA-C   MDM Rules/Calculators/A&P                          44 year old female who presents to the ED today with abnormal lab value of hemoglobin 4.6 that was drawn yesterday.  Has a history of iron deficiency anemia and is semicompliant on her ferrous sulfate.  Arrival to the ED patient is noted to be tachycardic in the 160s.  She is afebrile, mildly tachypneic.  She does appear anxious.  GU exam performed obvious blood on gloved finger which returned guaiac positive.  Concern for GI bleed.  Patient will need blood.  Will obtain type and screen and prepare for blood transfusion.  She states she feels generalized fatigue however has no other complaints.  No infectious etiology today.  I do not think a sepsis work-up is indicated at this time.  Patient will need admission.  CBC with noted leukocytosis of 26,000.  Hemoglobin 4.6 and hematocrit of 17.  It was also elevated at 886.  Difficult to ascertain whether leukocytosis is present due to acute  phase reactant from severe anemia.  She again denies any infectious etiology. U/A with trace leuks however no urinary sx. Will add CXR. Protonix have been ordered. Will consult GI.   BMP with a potassium of 2.9.  Will provide IV potassium and check mag.  Have discussed case with Dr. Evette Cristal with [facility] who will follow along and evaluate patient. Plan for medicine admission. Iron panel pending.   Discussed case with Dr. Jacqulyn Bath with [facility] who agrees to accept for admission.   This note was prepared using Dragon voice recognition software and may include unintentional dictation errors due to the inherent limitations of voice recognition software.   Final Clinical Impression(s) / ED Diagnoses Final diagnoses:  Symptomatic anemia  Generalized weakness    Rx / DC Orders ED Discharge Orders    None           Tanda Rockers, PA-C 03/30/20 0631    Derwood Kaplan, MD 03/30/20 [phone]

## 2020-03-29 NOTE — ED Notes (Signed)
Got patient on the monitor patient is resting with family at bedside and call bell in reach  °

## 2020-03-30 ENCOUNTER — Encounter (HOSPITAL_COMMUNITY): Payer: Self-pay | Admitting: Internal Medicine

## 2020-03-30 LAB — CBC
HCT: 24.1 % — ABNORMAL LOW (ref 36.0–46.0)
Hemoglobin: 7.2 g/dL — ABNORMAL LOW (ref 12.0–15.0)
MCH: 20 pg — ABNORMAL LOW (ref 26.0–34.0)
MCHC: 29.9 g/dL — ABNORMAL LOW (ref 30.0–36.0)
MCV: 66.9 fL — ABNORMAL LOW (ref 80.0–100.0)
Platelets: 648 10*3/uL — ABNORMAL HIGH (ref 150–400)
RBC: 3.6 MIL/uL — ABNORMAL LOW (ref 3.87–5.11)
RDW: 30.6 % — ABNORMAL HIGH (ref 11.5–15.5)
WBC: 19.5 10*3/uL — ABNORMAL HIGH (ref 4.0–10.5)
nRBC: 0.4 % — ABNORMAL HIGH (ref 0.0–0.2)

## 2020-03-30 LAB — APTT: aPTT: 38 seconds — ABNORMAL HIGH (ref 24–36)

## 2020-03-30 LAB — TSH: TSH: 2.012 u[IU]/mL (ref 0.350–4.500)

## 2020-03-30 LAB — COMPREHENSIVE METABOLIC PANEL
ALT: 9 U/L (ref 0–44)
AST: 12 U/L — ABNORMAL LOW (ref 15–41)
Albumin: 2 g/dL — ABNORMAL LOW (ref 3.5–5.0)
Alkaline Phosphatase: 80 U/L (ref 38–126)
Anion gap: 13 (ref 5–15)
BUN: 6 mg/dL (ref 6–20)
CO2: 26 mmol/L (ref 22–32)
Calcium: 8.1 mg/dL — ABNORMAL LOW (ref 8.9–10.3)
Chloride: 97 mmol/L — ABNORMAL LOW (ref 98–111)
Creatinine, Ser: 0.89 mg/dL (ref 0.44–1.00)
GFR calc Af Amer: >60 mL/min (ref >60)
GFR calc non Af Amer: >60 mL/min (ref >60)
Glucose, Bld: 119 mg/dL — ABNORMAL HIGH (ref 70–99)
Potassium: 2.8 mmol/L — ABNORMAL LOW (ref 3.5–5.1)
Sodium: 136 mmol/L (ref 135–145)
Total Bilirubin: 1.9 mg/dL — ABNORMAL HIGH (ref 0.3–1.2)
Total Protein: 7.4 g/dL (ref 6.5–8.1)

## 2020-03-30 LAB — PROTIME-INR
INR: 1.3 — ABNORMAL HIGH (ref 0.8–1.2)
Prothrombin Time: 15.4 seconds — ABNORMAL HIGH (ref 11.4–15.2)

## 2020-03-30 LAB — HEMOGLOBIN AND HEMATOCRIT, BLOOD
HCT: 26.9 % — ABNORMAL LOW (ref 36.0–46.0)
Hemoglobin: 8.2 g/dL — ABNORMAL LOW (ref 12.0–15.0)

## 2020-03-30 LAB — MAGNESIUM: Magnesium: 2 mg/dL (ref 1.7–2.4)

## 2020-03-30 LAB — PREPARE RBC (CROSSMATCH)

## 2020-03-30 LAB — HIV ANTIBODY (ROUTINE TESTING W REFLEX): HIV Screen 4th Generation wRfx: NONREACTIVE

## 2020-03-30 MED ORDER — MAGNESIUM SULFATE 2 GM/50ML IV SOLN
2.0000 g | Freq: Once | INTRAVENOUS | Status: AC
  Administered 2020-03-30: 2 g via INTRAVENOUS
  Filled 2020-03-30: qty 50

## 2020-03-30 MED ORDER — SODIUM CHLORIDE 0.9% IV SOLUTION: Freq: Once | INTRAVENOUS | Status: DC

## 2020-03-30 MED ORDER — POTASSIUM CHLORIDE CRYS ER 20 MEQ PO TBCR
40.0000 meq | EXTENDED_RELEASE_TABLET | Freq: Two times a day (BID) | ORAL | Status: AC
  Administered 2020-03-30 (×2): 40 meq via ORAL
  Filled 2020-03-30 (×2): qty 2

## 2020-03-30 MED ORDER — ACETAMINOPHEN 325 MG PO TABS
650.0000 mg | ORAL_TABLET | Freq: Once | ORAL | Status: DC
  Filled 2020-03-30: qty 2

## 2020-03-30 MED ORDER — SODIUM CHLORIDE 0.9 % IV SOLN: INTRAVENOUS | Status: DC

## 2020-03-30 NOTE — Progress Notes (Signed)
The on-call provider suggested that she will wait until the am lab results to address the critical Hgb level. Will continue to monitor the patient.

## 2020-03-30 NOTE — Progress Notes (Signed)
CRITICAL VALUE ALERT  Critical Value: Hgb 6.6  Date & Time Notied:  03/30/20 at 0038  Provider Notified: Bruna Potter, APP  Orders Received/Actions taken: Provider suggested to wait until AM lab results.

## 2020-03-30 NOTE — Progress Notes (Signed)
   03/30/20 0031  Assess: MEWS Score  Temp 98.3 F (36.8 C)  BP (!) 144/79  Pulse Rate (!) 111  ECG Heart Rate (!) 112  Resp 20  SpO2 100 %  O2 Device Room Air  Assess: MEWS Score  MEWS Temp 0  MEWS Systolic 0  MEWS Pulse 2  MEWS RR 0  MEWS LOC 0  MEWS Score 2  MEWS Score Color Yellow  Assess: if the MEWS score is Yellow or Red  Were vital signs taken at a resting state? Yes  Focused Assessment Documented focused assessment  Early Detection of Sepsis Score *See Row Information* Low  MEWS guidelines implemented *See Row Information* No, vital signs rechecked  Treat  MEWS Interventions Administered scheduled meds/treatments  Take Vital Signs  Increase Vital Sign Frequency  Yellow: Q 2hr X 2 then Q 4hr X 2, if remains yellow, continue Q 4hrs  Escalate  MEWS: Escalate Yellow: discuss with charge nurse/RN and consider discussing with provider and RRT  Notify: Charge Nurse/RN  Name of Charge Nurse/RN Notified Elease Hashimoto, RN  Date Charge Nurse/RN Notified 03/30/20  Time Charge Nurse/RN Notified 0035  Notify: Provider  Provider Name/Title Blount  APP  Date Provider Notified 03/30/20  Time Provider Notified [phone]  Notification Type Page  Notification Reason Other (Comment) (Yellow MEWs, HR and RR)  Response No new orders (Pt was admitted with that but getting better )  Date of Provider Response  (Provider has not responded yet)  Time of Provider Response  (Has not yet responded)  Document  Patient Outcome Stabilized after interventions  Progress note created (see row info) Yes

## 2020-03-30 NOTE — Progress Notes (Signed)
   03/30/20 0031  Vitals  Temp 98.3 F (36.8 C)  Temp Source Oral  BP (!) 144/79  MAP (mmHg) 98  BP Location Right Arm  BP Method Automatic  Patient Position (if appropriate) Lying  Pulse Rate (!) 111  ECG Heart Rate (!) 112  Cardiac Rhythm ST  Resp 20  Oxygen Therapy  SpO2 100 %  O2 Device Room Air  MEWS Score  MEWS Temp 0  MEWS Systolic 0  MEWS Pulse 2  MEWS RR 0  MEWS LOC 0  MEWS Score 2  MEWS Score Color Yellow  Provider Notification  Provider Name/Title Blount  APP  Date Provider Notified 03/30/20  Time Provider Notified [phone]  Notification Type Page  Notification Reason Other (Comment) (Yellow MEWs, HR and RR)  Response No new orders (Pt was admitted with that but getting better )  Date of Provider Response  (Provider has not responded yet)  Time of Provider Response  (Has not yet responded)

## 2020-03-30 NOTE — Progress Notes (Signed)
   03/30/20 0317  Assess: MEWS Score  Temp 98.8 F (37.1 C)  BP (!) 144/84  Pulse Rate (!) 106  ECG Heart Rate (!) 112  SpO2 99 %  Assess: MEWS Score  MEWS Temp 0  MEWS Systolic 0  MEWS Pulse 2  MEWS RR 0  MEWS LOC 0  MEWS Score 2  MEWS Score Color Yellow  Assess: if the MEWS score is Yellow or Red  Were vital signs taken at a resting state? Yes  Focused Assessment Documented focused assessment  Early Detection of Sepsis Score *See Row Information* Low  MEWS guidelines implemented *See Row Information* Yes  Treat  MEWS Interventions Administered scheduled meds/treatments  Take Vital Signs  Increase Vital Sign Frequency  Yellow: Q 2hr X 2 then Q 4hr X 2, if remains yellow, continue Q 4hrs  Escalate  MEWS: Escalate Yellow: discuss with charge nurse/RN and consider discussing with provider and RRT  Notify: Charge Nurse/RN  Name of Charge Nurse/RN Notified Elease Hashimoto, RN  Date Charge Nurse/RN Notified 03/30/20  Time Charge Nurse/RN Notified 0320  Notify: Provider  Provider Name/Title Bruna Potter, APP  Date Provider Notified 03/30/20  Time Provider Notified 0320  Notification Type Page  Notification Reason Other (Comment) (Yellow MEWS)  Response No new orders  Date of Provider Response 03/30/20  Time of Provider Response 0515  Document  Patient Outcome Stabilized after interventions  Progress note created (see row info) Yes

## 2020-03-30 NOTE — Progress Notes (Signed)
Patient anxious regarding taking medications. She has declined pre-transfusion tylenol, iron, and megace. Education given to patient. MD made aware. Patient remains anxious but states she will not take any additional medication.

## 2020-03-30 NOTE — Progress Notes (Signed)
CRITICAL VALUE ALERT  Critical Value:  Hgb 6.6  Date & Time Notied:  03/30/20 at 0038  Provider Notified: NP on call paged via Amion  Orders Received/Actions taken: Still waiting for APP Response

## 2020-03-30 NOTE — Progress Notes (Signed)
[facility] Progress Note  Subjective: No report of rectal bleeding.  Hemoglobin up to 7.2 g.  Patient seen by gynecology and their note was reviewed.  Objective: Vital signs in last 24 hours: Temp:  [98 F (36.7 C)-99.4 F (37.4 C)] 98 F (36.7 C) (06/13 0717) Pulse Rate:  [30-162] 114 (06/13 0950) Resp:  [16-37] 18 (06/13 0651) BP: (114-169)/(59-102) 154/82 (06/13 0950) SpO2:  [81 %-100 %] 100 % (06/13 0651) Weight:  [107.3 kg] 107.3 kg (06/13 0332) Weight change:    PE: No distress  Lab Results: Results for orders placed or performed during the hospital encounter of 03/29/20 (from the past 24 hour(s))  Type and screen [place]     Status: None   Collection Time: 03/29/20 12:15 PM  Result Value Ref Range   ABO/RH(D) O POS    Antibody Screen NEG    Sample Expiration 04/01/2020,2359    Unit Number [id number]    Blood Component Type RED CELLS,LR    Unit division 00    Status of Unit ISSUED,FINAL    Transfusion Status OK TO TRANSFUSE    Crossmatch Result Compatible    Unit Number [id number]    Blood Component Type RED CELLS,LR    Unit division 00    Status of Unit ISSUED,FINAL    Transfusion Status OK TO TRANSFUSE    Crossmatch Result      Compatible Performed at [facility], [address], [place], [place] [address]   ABO/Rh     Status: None   Collection Time: 03/29/20 12:15 PM  Result Value Ref Range   ABO/RH(D)      O POS Performed at [facility] [address] [place], [place] [address]   CBC with Differential     Status: Abnormal   Collection Time: 03/29/20 12:22 PM  Result Value Ref Range   WBC 26.0 (H) 4.0 - 10.5 K/uL   RBC 2.94 (L) 3.87 - 5.11 MIL/uL   Hemoglobin 4.6 (LL) 12.0 - 15.0 g/dL   HCT 17.1 (L) 36 - 46 %   MCV 58.2 (L) 80.0 - 100.0 fL   MCH 15.6 (L) 26.0 - 34.0 pg   MCHC 26.9 (L) 30.0 - 36.0 g/dL   RDW 22.3 (H) 11.5 - 15.5 %   Platelets 886 (H) 150 - 400 K/uL   nRBC 0.5 (H) 0.0 - 0.2 %    Neutrophils Relative % 82 %   Neutro Abs 21.2 (H) 1.7 - 7.7 K/uL   Lymphocytes Relative 10 %   Lymphs Abs 2.7 0.7 - 4.0 K/uL   Monocytes Relative 7 %   Monocytes Absolute 1.8 (H) 0 - 1 K/uL   Eosinophils Relative 0 %   Eosinophils Absolute 0.0 0 - 0 K/uL   Basophils Relative 0 %   Basophils Absolute 0.0 0 - 0 K/uL   Immature Granulocytes 1 %   Abs Immature Granulocytes 0.27 (H) 0.00 - 0.07 K/uL   Ovalocytes PRESENT   Basic metabolic panel     Status: Abnormal   Collection Time: 03/29/20 12:22 PM  Result Value Ref Range   Sodium 134 (L) 135 - 145 mmol/L   Potassium 2.9 (L) 3.5 - 5.1 mmol/L   Chloride 95 (L) 98 - 111 mmol/L   CO2 24 22 - 32 mmol/L   Glucose, Bld 183 (H) 70 - 99 mg/dL   BUN 6 6 - 20 mg/dL   Creatinine, Ser 1.00 0.44 - 1.00 mg/dL  Calcium 8.2 (L) 8.9 - 10.3 mg/dL   GFR calc non Af Amer >60 >60 mL/min   GFR calc Af Amer >60 >60 mL/min   Anion gap 15 5 - 15  Urinalysis, Routine w reflex microscopic     Status: Abnormal   Collection Time: 03/29/20 12:35 PM  Result Value Ref Range   Color, Urine YELLOW YELLOW   APPearance HAZY (A) CLEAR   Specific Gravity, Urine 1.020 1.005 - 1.030   pH 5.5 5.0 - 8.0   Glucose, UA NEGATIVE NEGATIVE mg/dL   Hgb urine dipstick SMALL (A) NEGATIVE   Bilirubin Urine NEGATIVE NEGATIVE   Ketones, ur NEGATIVE NEGATIVE mg/dL   Protein, ur 161 (A) NEGATIVE mg/dL   Nitrite NEGATIVE NEGATIVE   Leukocytes,Ua TRACE (A) NEGATIVE  Urinalysis, Microscopic (reflex)     Status: Abnormal   Collection Time: 03/29/20 12:35 PM  Result Value Ref Range   RBC / HPF 0-5 0 - 5 RBC/hpf   WBC, UA 6-10 0 - 5 WBC/hpf   Bacteria, UA RARE (A) NONE SEEN   Squamous Epithelial / LPF 0-5 0 - 5   Mucus PRESENT    Hyaline Casts, UA HYALINE CASTS    WBC Casts, UA PRESENT   I-Stat beta hCG blood, ED     Status: None   Collection Time: 03/29/20 12:38 PM  Result Value Ref Range   I-stat hCG, quantitative <5.0 <5 mIU/mL   Comment 3          POC occult blood,  ED     Status: Abnormal   Collection Time: 03/29/20 12:43 PM  Result Value Ref Range   Fecal Occult Bld POSITIVE (A) NEGATIVE  Rapid urine drug screen (hospital performed)     Status: None   Collection Time: 03/29/20 12:43 PM  Result Value Ref Range   Opiates NONE DETECTED NONE DETECTED   Cocaine NONE DETECTED NONE DETECTED   Benzodiazepines NONE DETECTED NONE DETECTED   Amphetamines NONE DETECTED NONE DETECTED   Tetrahydrocannabinol NONE DETECTED NONE DETECTED   Barbiturates NONE DETECTED NONE DETECTED  Prepare RBC (crossmatch)     Status: None   Collection Time: 03/29/20  1:21 PM  Result Value Ref Range   Order Confirmation      ORDER PROCESSED BY BLOOD BANK Performed at [facility], [address], [place], [place] [address]   SARS Coronavirus 2 by RT PCR (hospital order, performed in [facility] hospital lab) Nasopharyngeal Nasopharyngeal Swab     Status: None   Collection Time: 03/29/20  1:29 PM   Specimen: Nasopharyngeal Swab  Result Value Ref Range   SARS Coronavirus 2 NEGATIVE NEGATIVE  Vitamin B12     Status: None   Collection Time: 03/29/20  1:38 PM  Result Value Ref Range   Vitamin B-12 463 180 - 914 pg/mL  Folate     Status: None   Collection Time: 03/29/20  1:38 PM  Result Value Ref Range   Folate 13.0 >5.9 ng/mL  Iron and TIBC     Status: Abnormal   Collection Time: 03/29/20  1:38 PM  Result Value Ref Range   Iron 6 (L) 28 - 170 ug/dL   TIBC 540 (L) 981 - 191 ug/dL   Saturation Ratios 3 (L) 10.4 - 31.8 %   UIBC 166 ug/dL  Ferritin     Status: None   Collection Time: 03/29/20  1:38 PM  Result Value Ref Range   Ferritin 20 11 - 307 ng/mL  Reticulocytes  Status: Abnormal   Collection Time: 03/29/20  1:38 PM  Result Value Ref Range   Retic Ct Pct 1.7 0.4 - 3.1 %   RBC. 2.82 (L) 3.87 - 5.11 MIL/uL   Retic Count, Absolute 49.1 19.0 - 186.0 K/uL   Immature Retic Fract 28.7 (H) 2.3 - 15.9 %  Lactic acid, plasma     Status: Abnormal   Collection  Time: 03/29/20  1:38 PM  Result Value Ref Range   Lactic Acid, Venous 2.1 (HH) 0.5 - 1.9 mmol/L  Magnesium     Status: Abnormal   Collection Time: 03/29/20  1:38 PM  Result Value Ref Range   Magnesium 1.4 (L) 1.7 - 2.4 mg/dL  Hepatic function panel     Status: Abnormal   Collection Time: 03/29/20  1:38 PM  Result Value Ref Range   Total Protein 7.7 6.5 - 8.1 g/dL   Albumin 1.9 (L) 3.5 - 5.0 g/dL   AST 12 (L) 15 - 41 U/L   ALT 9 0 - 44 U/L   Alkaline Phosphatase 78 38 - 126 U/L   Total Bilirubin 0.6 0.3 - 1.2 mg/dL   Bilirubin, Direct <8.3 0.0 - 0.2 mg/dL   Indirect Bilirubin NOT CALCULATED 0.3 - 0.9 mg/dL  Protime-INR     Status: Abnormal   Collection Time: 03/29/20  1:38 PM  Result Value Ref Range   Prothrombin Time 16.3 (H) 11.4 - 15.2 seconds   INR 1.4 (H) 0.8 - 1.2  APTT     Status: None   Collection Time: 03/29/20  1:38 PM  Result Value Ref Range   aPTT 35 24 - 36 seconds  I-Stat venous blood gas, ([facility] ED)     Status: Abnormal   Collection Time: 03/29/20  1:49 PM  Result Value Ref Range   pH, Ven 7.720 (HH) 7.25 - 7.43   pCO2, Ven 19.4 (LL) 44 - 60 mmHg   pO2, Ven 30.0 (LL) 32 - 45 mmHg   Bicarbonate 25.0 20.0 - 28.0 mmol/L   TCO2 26 22 - 32 mmol/L   O2 Saturation 78.0 %   Acid-Base Excess 5.0 (H) 0.0 - 2.0 mmol/L   Sodium 136 135 - 145 mmol/L   Potassium 3.2 (L) 3.5 - 5.1 mmol/L   Calcium, Ion 0.98 (L) 1.15 - 1.40 mmol/L   HCT 17.0 (L) 36 - 46 %   Hemoglobin 5.8 (LL) 12.0 - 15.0 g/dL   Sample type VENOUS   I-Stat venous blood gas, [facility] ED)     Status: Abnormal   Collection Time: 03/29/20  2:18 PM  Result Value Ref Range   pH, Ven 7.645 (HH) 7.25 - 7.43   pCO2, Ven 23.4 (L) 44 - 60 mmHg   pO2, Ven 33.0 32 - 45 mmHg   Bicarbonate 25.5 20.0 - 28.0 mmol/L   TCO2 26 22 - 32 mmol/L   O2 Saturation 78.0 %   Acid-Base Excess 4.0 (H) 0.0 - 2.0 mmol/L   Sodium 137 135 - 145 mmol/L   Potassium 3.4 (L) 3.5 - 5.1 mmol/L   Calcium, Ion 1.03 (L) 1.15 - 1.40 mmol/L   HCT  19.0 (L) 36 - 46 %   Hemoglobin 6.5 (LL) 12.0 - 15.0 g/dL   Sample type VENOUS   CBG monitoring, ED     Status: Abnormal   Collection Time: 03/29/20  3:28 PM  Result Value Ref Range   Glucose-Capillary 132 (H) 70 - 99 mg/dL  TSH     Status: None  Collection Time: 03/29/20 10:58 PM  Result Value Ref Range   TSH 2.012 0.350 - 4.500 uIU/mL  Lactic acid, plasma     Status: None   Collection Time: 03/29/20 10:58 PM  Result Value Ref Range   Lactic Acid, Venous 1.4 0.5 - 1.9 mmol/L  CBC     Status: Abnormal   Collection Time: 03/29/20 10:58 PM  Result Value Ref Range   WBC 20.2 (H) 4.0 - 10.5 K/uL   RBC 3.30 (L) 3.87 - 5.11 MIL/uL   Hemoglobin 6.6 (LL) 12.0 - 15.0 g/dL   HCT 40.921.9 (L) 36 - 46 %   MCV 66.4 (L) 80.0 - 100.0 fL   MCH 20.0 (L) 26.0 - 34.0 pg   MCHC 30.1 30.0 - 36.0 g/dL   RDW 81.131.5 (H) 91.411.5 - 78.215.5 %   Platelets 332 150 - 400 K/uL   nRBC 0.5 (H) 0.0 - 0.2 %  Magnesium     Status: None   Collection Time: 03/30/20  8:46 AM  Result Value Ref Range   Magnesium 2.0 1.7 - 2.4 mg/dL  Comprehensive metabolic panel     Status: Abnormal   Collection Time: 03/30/20  8:46 AM  Result Value Ref Range   Sodium 136 135 - 145 mmol/L   Potassium 2.8 (L) 3.5 - 5.1 mmol/L   Chloride 97 (L) 98 - 111 mmol/L   CO2 26 22 - 32 mmol/L   Glucose, Bld 119 (H) 70 - 99 mg/dL   BUN 6 6 - 20 mg/dL   Creatinine, Ser 9.560.89 0.44 - 1.00 mg/dL   Calcium 8.1 (L) 8.9 - 10.3 mg/dL   Total Protein 7.4 6.5 - 8.1 g/dL   Albumin 2.0 (L) 3.5 - 5.0 g/dL   AST 12 (L) 15 - 41 U/L   ALT 9 0 - 44 U/L   Alkaline Phosphatase 80 38 - 126 U/L   Total Bilirubin 1.9 (H) 0.3 - 1.2 mg/dL   GFR calc non Af Amer >60 >60 mL/min   GFR calc Af Amer >60 >60 mL/min   Anion gap 13 5 - 15  Protime-INR     Status: Abnormal   Collection Time: 03/30/20  8:46 AM  Result Value Ref Range   Prothrombin Time 15.4 (H) 11.4 - 15.2 seconds   INR 1.3 (H) 0.8 - 1.2  APTT     Status: Abnormal   Collection Time: 03/30/20  8:46 AM   Result Value Ref Range   aPTT 38 (H) 24 - 36 seconds  CBC     Status: Abnormal   Collection Time: 03/30/20  8:46 AM  Result Value Ref Range   WBC 19.5 (H) 4.0 - 10.5 K/uL   RBC 3.60 (L) 3.87 - 5.11 MIL/uL   Hemoglobin 7.2 (L) 12.0 - 15.0 g/dL   HCT 21.324.1 (L) 36 - 46 %   MCV 66.9 (L) 80.0 - 100.0 fL   MCH 20.0 (L) 26.0 - 34.0 pg   MCHC 29.9 (L) 30.0 - 36.0 g/dL   RDW 08.630.6 (H) 57.811.5 - 46.915.5 %   Platelets 648 (H) 150 - 400 K/uL   nRBC 0.4 (H) 0.0 - 0.2 %    Studies/Results: DG Chest Port 1 View  Result Date: 03/29/2020 CLINICAL DATA:  Shortness of breath.  Weakness. EXAM: PORTABLE CHEST 1 VIEW COMPARISON:  None. FINDINGS: Lung volumes are low.The cardiomediastinal contours are normal. The lungs are clear. Pulmonary vasculature is normal. No consolidation, pleural effusion, or pneumothorax. No acute osseous abnormalities are seen.  Scoliotic curvature of the spine versus positioning. IMPRESSION: Low lung volumes without acute abnormality. Electronically Signed   By: Narda Rutherford M.D.   On: 03/29/2020 15:06   US PELVIC COMPLETE WITH TRANSVAGINAL  Result Date: 03/29/2020 CLINICAL DATA:  Menorrhagia EXAM: TRANSABDOMINAL ULTRASOUND OF PELVIS DOPPLER ULTRASOUND OF OVARIES TECHNIQUE: Transabdominal ultrasound examination of the pelvis was performed including evaluation of the uterus, ovaries, adnexal regions, and pelvic cul-de-sac. Color and duplex Doppler ultrasound was utilized to evaluate blood flow to the ovaries. COMPARISON:  None. FINDINGS: Uterus Measurements: 9.4 x 5.1 x 3.0 cm = volume: 177 mL. No fibroids or other mass visualized. Endometrium Thickness: 4 mm.  No focal abnormality visualized. Right ovary Measurements: 10.0 x 7.2 x 6.6 cm = volume: 250 mL. The right ovary is grossly enlarged by a complex cyst or mass, with heterogeneous internal echogenicity. There is no visualized color Doppler flow the right ovary. Left ovary The left ovary is nonvisualized. Other: Small volume right  adnexal free fluid. IMPRESSION: 1. The right ovary is grossly enlarged by a complex cyst or mass, heterogeneous internal echogenicity, measuring at least 10.0 cm. This may reflect a large hemorrhagic cyst or perhaps an ovarian teratoma. There is no visualized color Doppler flow to the right ovary, concerning for ovarian torsion. Any ovarian mass may serve as a nidus of torsion. 2.  The left ovary is nonvisualized. 3.  Small volume nonspecific right adnexal fluid. These results were called by telephone at the time of interpretation on 03/29/2020 at 7:12 pm to Dr. Ardean Larsen , who verbally acknowledged these results. Electronically Signed   By: Lauralyn Primes M.D.   On: 03/29/2020 19:12      Assessment: Anemia.  GYN pathology.  Plan:   Follow clinically.    SAM F Zyaira Vejar 03/30/2020, 11:42 AM  Pager: [phone] If no answer or after 5 PM call [phone]

## 2020-03-30 NOTE — Plan of Care (Signed)
Pt was transferred to [address]  from the ED, accompanied by ED staff. Alert and oriented X4. Skin warm and dry. Blood transfusion in progress. Pt has been oriented to the room. Call bell within reach. No discomfort or distress noted at this time. Will continue monitoring the pt closely.

## 2020-03-30 NOTE — Progress Notes (Signed)
PROGRESS NOTE    Tracey Shaw  BVQ:[id number] DOB: [birth year] DOA: 03/29/2020 PCP: Patient, No Pcp Per    Brief Narrative:  Tracey Shaw is a 44 y.o. female with medical history significant of hypertension, iron deficiency anemia presents to emergency department for the concern of low hemoglobin.She tells me that she has lower abdominal pain associated with vomiting since 2 days.  She vomited about couple of times, nonbloody.  Reports that she has history of heavy menstrual cycle which improves when she takes iron supplements. Patient received 2 unit of PRBC in ED.  EDP consulted GI.  [facility] hospitalist consulted for admission for symptomatic anemia.    Consultants:   Obgyn, GI  Procedures: [place]  Antimicrobials:   none   Subjective: C/o being hungry ad wants something to eat. No sob, dizziness, v/abd/n  Objective: Vitals:   03/30/20 0651 03/30/20 0717 03/30/20 0950 03/30/20 1155  BP:   (!) 154/82 (!) 151/69  Pulse: (!) 106  (!) 114   Resp: 18     Temp:  98 F (36.7 C)  98.6 F (37 C)  TempSrc:  Oral  Oral  SpO2: 100%     Weight:      Height:        Intake/Output Summary (Last 24 hours) at 03/30/2020 1219 Last data filed at 03/29/2020 2044 Gross per 24 hour  Intake 630 ml  Output --  Net 630 ml   Filed Weights   03/29/20 1930 03/30/20 0332  Weight: 107.3 kg 107.3 kg    Examination:  General exam: Appears calm and comfortable , husband at bedside Respiratory system: Clear to auscultation. Respiratory effort normal. Cardiovascular system: S1 & S2 heard, RRR. No JVD, murmurs, rubs, gallops or clicks.  Gastrointestinal system: Abdomen is nondistended, soft and nontender. Normal bowel sounds heard. Central nervous system: Alert and oriented. No focal neurological deficits. Extremities: no edema Skin: warm dry Psychiatry: Judgement and insight appear normal. Mood & affect appropriate.     Data Reviewed: I have personally reviewed following labs and  imaging studies  CBC: Recent Labs  Lab 03/29/20 1222 03/29/20 1349 03/29/20 1418 03/29/20 2258 03/30/20 0846  WBC 26.0*  --   --  20.2* 19.5*  NEUTROABS 21.2*  --   --   --   --   HGB 4.6* 5.8* 6.5* 6.6* 7.2*  HCT 17.1* 17.0* 19.0* 21.9* 24.1*  MCV 58.2*  --   --  66.4* 66.9*  PLT 886*  --   --  332 648*   Basic Metabolic Panel: Recent Labs  Lab 03/29/20 1222 03/29/20 1338 03/29/20 1349 03/29/20 1418 03/30/20 0846  NA 134*  --  136 137 136  K 2.9*  --  3.2* 3.4* 2.8*  CL 95*  --   --   --  97*  CO2 24  --   --   --  26  GLUCOSE 183*  --   --   --  119*  BUN 6  --   --   --  6  CREATININE 1.00  --   --   --  0.89  CALCIUM 8.2*  --   --   --  8.1*  MG  --  1.4*  --   --  2.0   GFR: Estimated Creatinine Clearance: 105.7 mL/min (by C-G formula based on SCr of 0.89 mg/dL). Liver Function Tests: Recent Labs  Lab 03/29/20 1338 03/30/20 0846  AST 12* 12*  ALT 9 9  ALKPHOS 78 80  BILITOT 0.6 1.9*  PROT 7.7 7.4  ALBUMIN 1.9* 2.0*   No results for input(s): LIPASE, AMYLASE in the last 168 hours. No results for input(s): AMMONIA in the last 168 hours. Coagulation Profile: Recent Labs  Lab 03/29/20 1338 03/30/20 0846  INR 1.4* 1.3*   Cardiac Enzymes: No results for input(s): CKTOTAL, CKMB, CKMBINDEX, TROPONINI in the last 168 hours. BNP (last 3 results) No results for input(s): PROBNP in the last 8760 hours. HbA1C: No results for input(s): HGBA1C in the last 72 hours. CBG: Recent Labs  Lab 03/29/20 1528  GLUCAP 132*   Lipid Profile: No results for input(s): CHOL, HDL, LDLCALC, TRIG, CHOLHDL, LDLDIRECT in the last 72 hours. Thyroid Function Tests: Recent Labs    03/29/20 2258  TSH 2.012   Anemia Panel: Recent Labs    03/29/20 1338  VITAMINB12 463  FOLATE 13.0  FERRITIN 20  TIBC 172*  IRON 6*  RETICCTPCT 1.7   Sepsis Labs: Recent Labs  Lab 03/29/20 1338 03/29/20 2258  LATICACIDVEN 2.1* 1.4    Recent Results (from the past 240 hour(s))    SARS Coronavirus 2 by RT PCR (hospital order, performed in [facility] hospital lab) Nasopharyngeal Nasopharyngeal Swab     Status: None   Collection Time: 03/29/20  1:29 PM   Specimen: Nasopharyngeal Swab  Result Value Ref Range Status   SARS Coronavirus 2 NEGATIVE NEGATIVE Final    Comment: (NOTE) SARS-CoV-2 target nucleic acids are NOT DETECTED.  The SARS-CoV-2 RNA is generally detectable in upper and lower respiratory specimens during the acute phase of infection. The lowest concentration of SARS-CoV-2 viral copies this assay can detect is 250 copies / mL. A negative result does not preclude SARS-CoV-2 infection and should not be used as the sole basis for treatment or other patient management decisions.  A negative result may occur with improper specimen collection / handling, submission of specimen other than nasopharyngeal swab, presence of viral mutation(s) within the areas targeted by this assay, and inadequate number of viral copies (<250 copies / mL). A negative result must be combined with clinical observations, patient history, and epidemiological information.  Fact Sheet for Patients:   BoilerBrush.com.cy  Fact Sheet for Healthcare Providers: https://pope.com/  This test is not yet approved or  cleared by the [place] FDA and has been authorized for detection and/or diagnosis of SARS-CoV-2 by FDA under an Emergency Use Authorization (EUA).  This EUA will remain in effect (meaning this test can be used) for the duration of the COVID-19 declaration under Section 564(b)(1) of the Act, 21 U.S.C. section 360bbb-3(b)(1), unless the authorization is terminated or revoked sooner.  Performed at [facility], [address] [place], [place] [address]          Radiology Studies: DG Chest Port 1 View  Result Date: 03/29/2020 CLINICAL DATA:  Shortness of breath.  Weakness. EXAM: PORTABLE CHEST 1 VIEW  COMPARISON:  None. FINDINGS: Lung volumes are low.The cardiomediastinal contours are normal. The lungs are clear. Pulmonary vasculature is normal. No consolidation, pleural effusion, or pneumothorax. No acute osseous abnormalities are seen. Scoliotic curvature of the spine versus positioning. IMPRESSION: Low lung volumes without acute abnormality. Electronically Signed   By: Narda Rutherford M.D.   On: 03/29/2020 15:06   US PELVIC COMPLETE WITH TRANSVAGINAL  Result Date: 03/29/2020 CLINICAL DATA:  Menorrhagia EXAM: TRANSABDOMINAL ULTRASOUND OF PELVIS DOPPLER ULTRASOUND OF OVARIES TECHNIQUE: Transabdominal ultrasound examination of the pelvis was performed including evaluation of the uterus, ovaries, adnexal regions,  and pelvic cul-de-sac. Color and duplex Doppler ultrasound was utilized to evaluate blood flow to the ovaries. COMPARISON:  None. FINDINGS: Uterus Measurements: 9.4 x 5.1 x 3.0 cm = volume: 177 mL. No fibroids or other mass visualized. Endometrium Thickness: 4 mm.  No focal abnormality visualized. Right ovary Measurements: 10.0 x 7.2 x 6.6 cm = volume: 250 mL. The right ovary is grossly enlarged by a complex cyst or mass, with heterogeneous internal echogenicity. There is no visualized color Doppler flow the right ovary. Left ovary The left ovary is nonvisualized. Other: Small volume right adnexal free fluid. IMPRESSION: 1. The right ovary is grossly enlarged by a complex cyst or mass, heterogeneous internal echogenicity, measuring at least 10.0 cm. This may reflect a large hemorrhagic cyst or perhaps an ovarian teratoma. There is no visualized color Doppler flow to the right ovary, concerning for ovarian torsion. Any ovarian mass may serve as a nidus of torsion. 2.  The left ovary is nonvisualized. 3.  Small volume nonspecific right adnexal fluid. These results were called by telephone at the time of interpretation on 03/29/2020 at 7:12 pm to Dr. Ardean Larsen , who verbally acknowledged these  results. Electronically Signed   By: Lauralyn Primes M.D.   On: 03/29/2020 19:12        Scheduled Meds:  sodium chloride   Intravenous Once   acetaminophen  650 mg Oral Once   ferrous sulfate  325 mg Oral Q breakfast   megestrol  40 mg Oral Daily   metoprolol tartrate  12.5 mg Oral BID   ondansetron  4 mg Oral Once   potassium chloride  40 mEq Oral Once   potassium chloride  40 mEq Oral BID   Continuous Infusions:  magnesium sulfate bolus IVPB      Assessment & Plan:   Principal Problem:   Acute blood loss anemia Active Problems:   Hypertension   Symptomatic anemia   GI bleed   Hypomagnesemia   Hyponatremia   Hypokalemia   Hypochloremia   Leukocytosis   Lactic acid acidosis   Thrombocytosis (HCC)   Ovarian mass, right   Menorrhagia   Morbid obesity (HCC)   Acute blood loss anemia/symptomatic anemia: -Patient's hemoglobin noted to be 4.6/17.1.  POC occult blood positive. -Received 2 unit of PRBC in the ED. GI's  input was appreciated.-they will follow clinically Hg 7.2  , will transfuse 1 unit prbc today. May consider iv iron too. Pt refused to take megace and feso4 dose here per obgyn rec. Patient has history of menorrhagia-pelvic ultrasound with mass-GYN consulted and input appreciated -started Megace 40 mg twice daily in case she begins to have vaginal bleeding again. -Per OB/GYN  plan check Ca125 and consider MRI for further delineation of this pelvic mass. Plan: Transfuse 1 unit prbc Ck post h/h  Hypomagnesemia: Replenished Still low, will give iv magnesium 2gm x1  Hypokalemia- will replace again , monitor levels.   Severe iron deficiency anemia: -H&H: 4.6/17.1, reviewed iron studies.  MCV: 58.2 -Continue iron supplement May consider iv iron after transfusion  Asymptomatic bacteriuria: Patient denies any urinary symptoms -Will not start on antibiotics at this time.  Thrombocytosis: Platelet: 886 -Likely reactive Start ivf. If no  improvement , can consult hematology as outpt  Leukocytosis: possibly stress induced/reactive Improving.   Patient is afebrile.  Chest x-ray is negative. cotninue to monitor   DVT prophylaxis: SCD/TED Code Status: Full code Family Communication: husband at bedside Disposition Plan: Likely home in am, if h/h stable  Barrier: needs ivf, transfusion, electrolyte stabilization      LOS: 1 day   Time spent: 45 min with >50% on coc    Nolberto Hanlon, MD [facility] Pager [phone]  If 7PM-7AM, please contact night-coverage [email or web address] Password [facility] 03/30/2020, 12:19 PM

## 2020-03-31 LAB — TYPE AND SCREEN
ABO/RH(D): O POS
Antibody Screen: NEGATIVE
Unit division: 0
Unit division: 0
Unit division: 0

## 2020-03-31 LAB — BASIC METABOLIC PANEL
Anion gap: 9 (ref 5–15)
BUN: 5 mg/dL — ABNORMAL LOW (ref 6–20)
CO2: 26 mmol/L (ref 22–32)
Calcium: 8.1 mg/dL — ABNORMAL LOW (ref 8.9–10.3)
Chloride: 103 mmol/L (ref 98–111)
Creatinine, Ser: 0.87 mg/dL (ref 0.44–1.00)
GFR calc Af Amer: >60 mL/min (ref >60)
GFR calc non Af Amer: >60 mL/min (ref >60)
Glucose, Bld: 126 mg/dL — ABNORMAL HIGH (ref 70–99)
Potassium: 3.3 mmol/L — ABNORMAL LOW (ref 3.5–5.1)
Sodium: 138 mmol/L (ref 135–145)

## 2020-03-31 LAB — CBC
HCT: 27.2 % — ABNORMAL LOW (ref 36.0–46.0)
Hemoglobin: 8.3 g/dL — ABNORMAL LOW (ref 12.0–15.0)
MCH: 21.5 pg — ABNORMAL LOW (ref 26.0–34.0)
MCHC: 30.5 g/dL (ref 30.0–36.0)
MCV: 70.5 fL — ABNORMAL LOW (ref 80.0–100.0)
Platelets: 701 10*3/uL — ABNORMAL HIGH (ref 150–400)
RBC: 3.86 MIL/uL — ABNORMAL LOW (ref 3.87–5.11)
WBC: 23 10*3/uL — ABNORMAL HIGH (ref 4.0–10.5)
nRBC: 0.3 % — ABNORMAL HIGH (ref 0.0–0.2)

## 2020-03-31 LAB — BPAM RBC
Blood Product Expiration Date: 202107092359
Blood Product Expiration Date: 202107122359
Blood Product Expiration Date: 202107142359
ISSUE DATE / TIME: 202106121358
ISSUE DATE / TIME: 202106121631
ISSUE DATE / TIME: 202106131413
Unit Type and Rh: 5100
Unit Type and Rh: 5100
Unit Type and Rh: 5100

## 2020-03-31 LAB — MAGNESIUM: Magnesium: 2 mg/dL (ref 1.7–2.4)

## 2020-03-31 LAB — CA 125: Cancer Antigen (CA) 125: 9.2 U/mL (ref 0.0–38.1)

## 2020-03-31 MED ORDER — POTASSIUM CHLORIDE CRYS ER 20 MEQ PO TBCR
40.0000 meq | EXTENDED_RELEASE_TABLET | Freq: Once | ORAL | Status: AC
  Administered 2020-03-31: 40 meq via ORAL
  Filled 2020-03-31: qty 2

## 2020-03-31 MED ORDER — METOPROLOL TARTRATE 25 MG PO TABS: 12.5000 mg | ORAL_TABLET | Freq: Two times a day (BID) | ORAL | 0 refills | Status: AC

## 2020-03-31 MED ORDER — MEGESTROL ACETATE 40 MG PO TABS: 40.0000 mg | ORAL_TABLET | Freq: Every day | ORAL | 0 refills | Status: AC

## 2020-03-31 NOTE — Progress Notes (Signed)
   03/30/20 2014  Assess: MEWS Score  Temp 99.3 F (37.4 C)  BP 139/73  Pulse Rate (!) 128  Resp 17  SpO2 100 %  O2 Device Room Air  Assess: MEWS Score  MEWS Temp 0  MEWS Systolic 0  MEWS Pulse 2  MEWS RR 0  MEWS LOC 0  MEWS Score 2  MEWS Score Color Yellow  Assess: if the MEWS score is Yellow or Red  Were vital signs taken at a resting state? Yes  Focused Assessment Documented focused assessment  Early Detection of Sepsis Score *See Row Information* Low  MEWS guidelines implemented *See Row Information* No, previously yellow, continue vital signs every 4 hours  Treat  MEWS Interventions Administered scheduled meds/treatments  Notify: Charge Nurse/RN  Name of Charge Nurse/RN Notified Alisha, RN  Date Charge Nurse/RN Notified 03/30/20  Time Charge Nurse/RN Notified 2048  Document  Patient Outcome Other (Comment) (HR remains elevated around 110-115)  Progress note created (see row info) Yes   Patient with yellow MEWS due to increased HR.  HR remains 110-118, only elevating above 120 with ambulation.  Patient has discomfort but declines use of pain medication.  Elevated HR has been chronic for patient since admission.

## 2020-03-31 NOTE — Progress Notes (Signed)
The patient is doing well today and hopes to go home.  GYNs note was reviewed with plans for outpatient follow-up.  I discussed with the patient that another possible source of iron deficiency anemia could be an underlying colon lesion and we talked about having her call our office after she is discharged to schedule a follow-up outpatient colonoscopy which she is agreeable to do.  Our office number is [phone].  She has agreed to call to schedule an outpatient colonoscopy.  We will sign off.  Call us if needed.

## 2020-03-31 NOTE — Progress Notes (Signed)
   03/31/20 1217  Assess: MEWS Score  Temp 98.7 F (37.1 C)  BP (!) 159/83  Pulse Rate (!) 117  Resp 18  SpO2 100 %  O2 Device Room Air  Patient Activity (if Appropriate) In bed  Assess: MEWS Score  MEWS Temp 0  MEWS Systolic 0  MEWS Pulse 2  MEWS RR 0  MEWS LOC 0  MEWS Score 2  MEWS Score Color Yellow  Assess: if the MEWS score is Yellow or Red  Were vital signs taken at a resting state? Yes  Focused Assessment Documented focused assessment  MEWS guidelines implemented *See Row Information* No, previously yellow, continue vital signs every 4 hours  Take Vital Signs  Increase Vital Sign Frequency  Yellow: Q 2hr X 2 then Q 4hr X 2, if remains yellow, continue Q 4hrs  Escalate  MEWS: Escalate Yellow: discuss with charge nurse/RN and consider discussing with provider and RRT  Document  Patient Outcome Stabilized after interventions  Progress note created (see row info) Yes   - Patient just got back from the restroom, pt is asymptomatic. - a little anxious about her d/c, complaining of pain on lower abd but had PRN pain medication earlier, advised to relax. HR now is in low 110's

## 2020-03-31 NOTE — Discharge Summary (Signed)
Tracey Shaw:[id number] DOB: Sep 28, 1976 DOA: 03/29/2020  PCP: Patient, No Pcp Per  Admit date: 03/29/2020 Discharge date: 03/31/2020  Admitted From: Home Disposition: Home  Recommendations for Outpatient Follow-up:  1. Follow up with PCP in 1 week 2. Please obtain BMP/CBC in one week 3. Gastroenterology Dr. Evette Cristal in 1 week 4. OB/GYN in 1 week Dr. Shawnie Pons  Home Health: None   Discharge Condition:Stable CODE STATUS: Full Diet recommendation: Heart Healthy  Brief/Interim Summary: Tracey Shaw is a 44 y.o. female with medical history significant of hypertension, iron deficiency anemia presents to emergency department for the concern of low hemoglobin.  Patient was found on routine labs with anemia and was called by her PCP to come to the ER for further evaluation .patient has history of of iron deficiency anemia and semicompliant with iron supplements.  Her stool is always black due to iron pills.    She was found to be stool occult positive.   GI and OB/GYN were consulted.  GI would like patient to follow-up as outpatient colonoscopy and patient was apparently agreeable. GYN saw patient and recommended considering Iron infusion , iron pills , and megace which patient refused all . She even didn't want to take tylenol for pain as she does not like taking medications. Finally after much discussion she said to send script for megace and she 'will consider taking it at home".. She was found with anemia,and received total of 3 units packed red blood cell transfusion.  Ultrasound was obtained revealing a right ovary grossly enlarged by complex cyst or mass.  Measuring 10 cm.  Please see full report below. Per OBGYN doubt ovarian torsion and need for acute intervention. Planned to  check Ca125 and consider MRI for further delineation of this pelvic mass as outpatient, and since patient has not seen a gynecologist since 2014 there is plan to update her Pap, mammogram, endometrial biopsy  prior to scheduling possible surgery for a hysterectomy.  There was also a discussion about endometrial ablation and IUD as an alternative.  Again patient was started on Megace in case she begins to have vaginal bleeding again however she refused it.  Patient was also found with leukocytosis and thrombocytosis, it was thought to be reactive. Can follow up with pcp for monitoring. Her h/h stable today , 8.3/27.2. She has mild sinus tachycardia and I wanted her to stay one more night but patient refused , and states she is anxious being here and causing her heart rate up and wants to go home. All the iv lines are making her nervous per patient. She is adamant on going home today.She also states she has an appointment with her PCP tomorrow and wants to go home so she can make her appointment.   Discharge Diagnoses:  Principal Problem:   Acute blood loss anemia Active Problems:   Hypertension   Symptomatic anemia   GI bleed   Hypomagnesemia   Hyponatremia   Hypokalemia   Hypochloremia   Leukocytosis   Lactic acid acidosis   Thrombocytosis (HCC)   Ovarian mass, right   Menorrhagia   Morbid obesity [facility])    Discharge Instructions  Discharge Instructions    Call MD for:  extreme fatigue   Complete by: As directed    Call MD for:  persistant dizziness or light-headedness   Complete by: As directed    Call MD for:  severe uncontrolled pain   Complete by: As directed    Diet - low sodium heart healthy  Complete by: As directed    Discharge instructions   Complete by: As directed    Follow up with pcp in 1-3 days have blood work Follow-up gastroenterology Dr. Penelope Coop at [phone] within 1 week Follow-up with Dr. Kennon Rounds OB[facility] at [facility] in 1 week   Increase activity slowly   Complete by: As directed      Allergies as of 03/31/2020   No Known Allergies     Medication List    TAKE these medications   ferrous sulfate 325 (65 FE) MG tablet Take 325 mg by mouth  daily with breakfast.   fluticasone 50 MCG/ACT nasal spray Commonly known as: FLONASE Place 2 sprays into the nose daily.   fluticasone 50 MCG/ACT nasal spray Commonly known as: FLONASE Place 2 sprays into the nose daily.   megestrol 40 MG tablet Commonly known as: MEGACE Take 1 tablet (40 mg total) by mouth daily. Start taking on: April 01, 2020   metoprolol tartrate 25 MG tablet Commonly known as: LOPRESSOR Take 0.5 tablets (12.5 mg total) by mouth 2 (two) times daily. What changed: when to take this       [place] [facility] [place] [facility] Follow up in 2 week(s).   Specialty: Obstetrics and Gynecology Why: Dr Kennon Rounds consulted in the hospital Contact information: [address] [place] [address] [phone]       Tracey Horner, MD Follow up in 1 week(s).   Specialty: Gastroenterology Contact information: [address] [place] [address] [phone]              No Known Allergies  Consultations:  GI  obgyn   Procedures/Studies: DG Chest Port 1 View  Result Date: 03/29/2020 CLINICAL DATA:  Shortness of breath.  Weakness. EXAM: PORTABLE CHEST 1 VIEW COMPARISON:  None. FINDINGS: Lung volumes are low.The cardiomediastinal contours are normal. The lungs are clear. Pulmonary vasculature is normal. No consolidation, pleural effusion, or pneumothorax. No acute osseous abnormalities are seen. Scoliotic curvature of the spine versus positioning. IMPRESSION: Low lung volumes without acute abnormality. Electronically Signed   By: Keith Rake M.D.   On: 03/29/2020 15:06   US PELVIC COMPLETE WITH TRANSVAGINAL  Result Date: 03/29/2020 CLINICAL DATA:  Menorrhagia EXAM: TRANSABDOMINAL ULTRASOUND OF PELVIS DOPPLER ULTRASOUND OF OVARIES TECHNIQUE: Transabdominal ultrasound examination of the pelvis was performed including evaluation of the uterus, ovaries, adnexal regions,  and pelvic cul-de-sac. Color and duplex Doppler ultrasound was utilized to evaluate blood flow to the ovaries. COMPARISON:  None. FINDINGS: Uterus Measurements: 9.4 x 5.1 x 3.0 cm = volume: 177 mL. No fibroids or other mass visualized. Endometrium Thickness: 4 mm.  No focal abnormality visualized. Right ovary Measurements: 10.0 x 7.2 x 6.6 cm = volume: 250 mL. The right ovary is grossly enlarged by a complex cyst or mass, with heterogeneous internal echogenicity. There is no visualized color Doppler flow the right ovary. Left ovary The left ovary is nonvisualized. Other: Small volume right adnexal free fluid. IMPRESSION: 1. The right ovary is grossly enlarged by a complex cyst or mass, heterogeneous internal echogenicity, measuring at least 10.0 cm. This may reflect a large hemorrhagic cyst or perhaps an ovarian teratoma. There is no visualized color Doppler flow to the right ovary, concerning for ovarian torsion. Any ovarian mass may serve as a nidus of torsion. 2.  The left ovary is nonvisualized. 3.  Small volume nonspecific right adnexal fluid. These  results were called by telephone at the time of interpretation on 03/29/2020 at 7:12 pm to Dr. Ardean Larsen , who verbally acknowledged these results. Electronically Signed   By: Lauralyn Primes M.D.   On: 03/29/2020 19:12       Subjective: No complaints. Feels anxious being here. No sob, abd, pain, n/v  Discharge Exam: Vitals:   03/31/20 0743 03/31/20 1217  BP: (!) 156/87 (!) 159/83  Pulse: (!) 109 (!) 117  Resp: 18 18  Temp: 98.8 F (37.1 C) 98.7 F (37.1 C)  SpO2: 100% 100%   Vitals:   03/31/20 0022 03/31/20 0418 03/31/20 0743 03/31/20 1217  BP:  (!) 145/78 (!) 156/87 (!) 159/83  Pulse:   (!) 109 (!) 117  Resp:  18 18 18   Temp:  98 F (36.7 C) 98.8 F (37.1 C) 98.7 F (37.1 C)  TempSrc:  Oral Oral Oral  SpO2:  98% 100% 100%  Weight: 107.8 kg     Height:        General: Pt is alert, awake, not in acute distress Cardiovascular:  Regular mildly tachy, S1/S2 +, no rubs, no gallops Respiratory: CTA bilaterally, no wheezing, no rhonchi Abdominal: Soft, NT, ND, bowel sounds + Extremities: no edema, no cyanosis    The results of significant diagnostics from this hospitalization (including imaging, microbiology, ancillary and laboratory) are listed below for reference.     Microbiology: Recent Results (from the past 240 hour(s))  SARS Coronavirus 2 by RT PCR (hospital order, performed in [facility] hospital lab) Nasopharyngeal Nasopharyngeal Swab     Status: None   Collection Time: 03/29/20  1:29 PM   Specimen: Nasopharyngeal Swab  Result Value Ref Range Status   SARS Coronavirus 2 NEGATIVE NEGATIVE Final    Comment: (NOTE) SARS-CoV-2 target nucleic acids are NOT DETECTED.  The SARS-CoV-2 RNA is generally detectable in upper and lower respiratory specimens during the acute phase of infection. The lowest concentration of SARS-CoV-2 viral copies this assay can detect is 250 copies / mL. A negative result does not preclude SARS-CoV-2 infection and should not be used as the sole basis for treatment or other patient management decisions.  A negative result may occur with improper specimen collection / handling, submission of specimen other than nasopharyngeal swab, presence of viral mutation(s) within the areas targeted by this assay, and inadequate number of viral copies (<250 copies / mL). A negative result must be combined with clinical observations, patient history, and epidemiological information.  Fact Sheet for Patients:   05/29/20  Fact Sheet for Healthcare Providers: BoilerBrush.com.cy  This test is not yet approved or  cleared by the https://pope.com/ FDA and has been authorized for detection and/or diagnosis of SARS-CoV-2 by FDA under an Emergency Use Authorization (EUA).  This EUA will remain in effect (meaning this test can be used) for the  duration of the COVID-19 declaration under Section 564(b)(1) of the Act, 21 U.S.C. section 360bbb-3(b)(1), unless the authorization is terminated or revoked sooner.  Performed at [facility], [address], [place], [place]      Labs: BNP (last 3 results) No results for input(s): BNP in the last 8760 hours. Basic Metabolic Panel: Recent Labs  Lab 03/29/20 1222 03/29/20 1338 03/29/20 1349 03/29/20 1418 03/30/20 0846 03/31/20 0653  NA 134*  --  136 137 136 138  K 2.9*  --  3.2* 3.4* 2.8* 3.3*  CL 95*  --   --   --  97* 103  CO2 24  --   --   --  26 26  GLUCOSE 183*  --   --   --  119* 126*  BUN 6  --   --   --  6 5*  CREATININE 1.00  --   --   --  0.89 0.87  CALCIUM 8.2*  --   --   --  8.1* 8.1*  MG  --  1.4*  --   --  2.0 2.0   Liver Function Tests: Recent Labs  Lab 03/29/20 1338 03/30/20 0846  AST 12* 12*  ALT 9 9  ALKPHOS 78 80  BILITOT 0.6 1.9*  PROT 7.7 7.4  ALBUMIN 1.9* 2.0*   No results for input(s): LIPASE, AMYLASE in the last 168 hours. No results for input(s): AMMONIA in the last 168 hours. CBC: Recent Labs  Lab 03/29/20 1222 03/29/20 1349 03/29/20 1418 03/29/20 2258 03/30/20 0846 03/30/20 1957 03/31/20 0653  WBC 26.0*  --   --  20.2* 19.5*  --  23.0*  NEUTROABS 21.2*  --   --   --   --   --   --   HGB 4.6*   < > 6.5* 6.6* 7.2* 8.2* 8.3*  HCT 17.1*   < > 19.0* 21.9* 24.1* 26.9* 27.2*  MCV 58.2*  --   --  66.4* 66.9*  --  70.5*  PLT 886*  --   --  332 648*  --  701*   < > = values in this interval not displayed.   Cardiac Enzymes: No results for input(s): CKTOTAL, CKMB, CKMBINDEX, TROPONINI in the last 168 hours. BNP: Invalid input(s): POCBNP CBG: Recent Labs  Lab 03/29/20 1528  GLUCAP 132*   D-Dimer No results for input(s): DDIMER in the last 72 hours. Hgb A1c No results for input(s): HGBA1C in the last 72 hours. Lipid Profile No results for input(s): CHOL, HDL, LDLCALC, TRIG, CHOLHDL, LDLDIRECT in the last 72  hours. Thyroid function studies Recent Labs    03/29/20 2258  TSH 2.012   Anemia work up Recent Labs    03/29/20 1338  VITAMINB12 463  FOLATE 13.0  FERRITIN 20  TIBC 172*  IRON 6*  RETICCTPCT 1.7   Urinalysis    Component Value Date/Time   COLORURINE YELLOW 03/29/2020 1235   APPEARANCEUR HAZY (A) 03/29/2020 1235   LABSPEC 1.020 03/29/2020 1235   PHURINE 5.5 03/29/2020 1235   GLUCOSEU NEGATIVE 03/29/2020 1235   HGBUR SMALL (A) 03/29/2020 1235   BILIRUBINUR NEGATIVE 03/29/2020 1235   KETONESUR NEGATIVE 03/29/2020 1235   PROTEINUR 100 (A) 03/29/2020 1235   NITRITE NEGATIVE 03/29/2020 1235   LEUKOCYTESUR TRACE (A) 03/29/2020 1235   Sepsis Labs Invalid input(s): PROCALCITONIN,  WBC,  LACTICIDVEN Microbiology Recent Results (from the past 240 hour(s))  SARS Coronavirus 2 by RT PCR (hospital order, performed in [facility] hospital lab) Nasopharyngeal Nasopharyngeal Swab     Status: None   Collection Time: 03/29/20  1:29 PM   Specimen: Nasopharyngeal Swab  Result Value Ref Range Status   SARS Coronavirus 2 NEGATIVE NEGATIVE Final    Comment: (NOTE) SARS-CoV-2 target nucleic acids are NOT DETECTED.  The SARS-CoV-2 RNA is generally detectable in upper and lower respiratory specimens during the acute phase of infection. The lowest concentration of SARS-CoV-2 viral copies this assay can detect is 250 copies / mL. A negative result does not preclude SARS-CoV-2 infection and should not be used as the sole basis for treatment or other patient management decisions.  A negative result may occur with improper specimen collection / handling, submission of specimen other than nasopharyngeal swab, presence of viral mutation(s) within the areas targeted by this assay, and inadequate number of viral copies (<250 copies / mL). A negative result must be combined with clinical observations, patient history, and epidemiological information.  Fact Sheet for Patients:    BoilerBrush.com.cyhttps://www.fda.gov/media/136312/download  Fact Sheet for Healthcare Providers: https://pope.com/https://www.fda.gov/media/136313/download  This test is not yet approved or  cleared by the [place] FDA and has been authorized for detection and/or diagnosis of SARS-CoV-2 by FDA under an Emergency Use Authorization (EUA).  This EUA will remain in effect (meaning this test can be used) for the duration of the COVID-19 declaration under Section 564(b)(1) of the Act, 21 U.S.C. section 360bbb-3(b)(1), unless the authorization is terminated or revoked sooner.  Performed at [facility], [address] [place], [place] [address]      Time coordinating discharge: Over 30 minutes  SIGNED:   Lynn ItoSahar Jun Rightmyer, MD  [facility] 03/31/2020, 1:07 PM Pager   If 7PM-7AM, please contact night-coverage [email or web address] Password [email or web address]

## 2020-04-02 ENCOUNTER — Telehealth: Payer: Self-pay | Admitting: Family Medicine

## 2020-04-02 ENCOUNTER — Telehealth: Payer: Self-pay | Admitting: Lactation Services

## 2020-04-02 NOTE — Telephone Encounter (Signed)
Opened in error

## 2020-04-02 NOTE — Telephone Encounter (Signed)
Attempted to contact patient with some appointment information. No answer, left voicemail for patient to give the office a call back to receive appointment information.

## 2020-04-24 ENCOUNTER — Encounter: Payer: Self-pay | Admitting: Family Medicine

## 2020-04-24 ENCOUNTER — Ambulatory Visit: Payer: BC Managed Care – PPO | Admitting: Family Medicine

## 2020-04-24 NOTE — Progress Notes (Signed)
Patient did not keep appointment today. She may call to reschedule.  

## 2021-04-15 IMAGING — US US PELVIS COMPLETE WITH TRANSVAGINAL
1 series · 13 of 25 positions shown · non-contrast
Comparison: None.

CLINICAL DATA: Menorrhagia

EXAM:
TRANSABDOMINAL ULTRASOUND OF PELVIS
DOPPLER ULTRASOUND OF OVARIES
TECHNIQUE: Transabdominal ultrasound examination of the pelvis was performed
including evaluation of the uterus, ovaries, adnexal regions, and
pelvic cul-de-sac.
Color and duplex Doppler ultrasound was utilized to evaluate blood
flow to the ovaries.

[Series 1: gyn us · 13 of 90 slices shown]
[im 1/90]
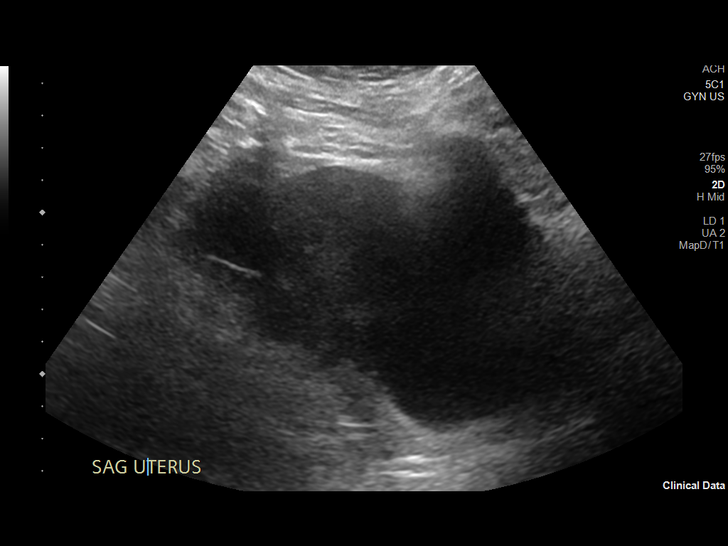
[im 8/90]
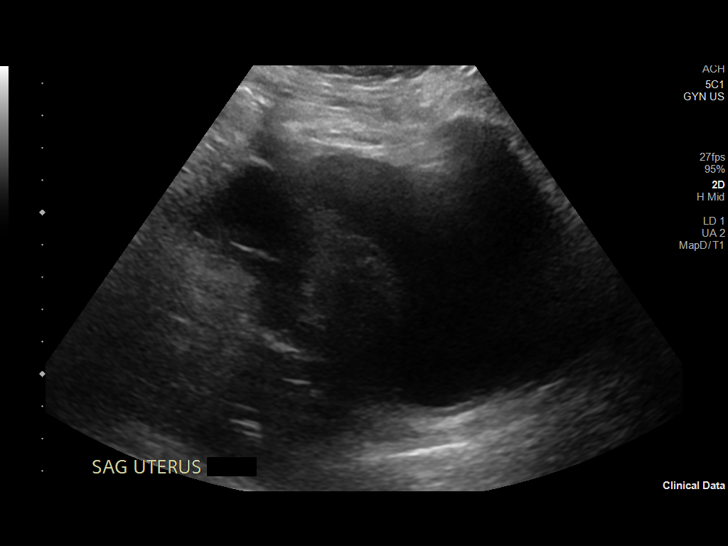
[im 15/90]
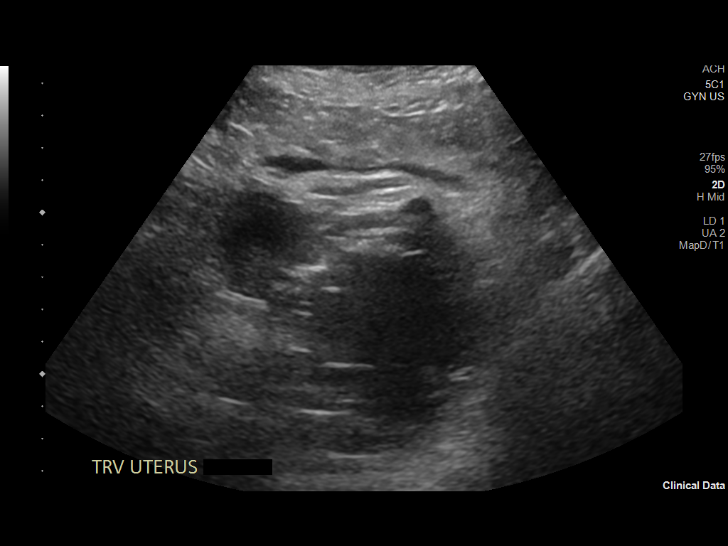
[im 23/90]
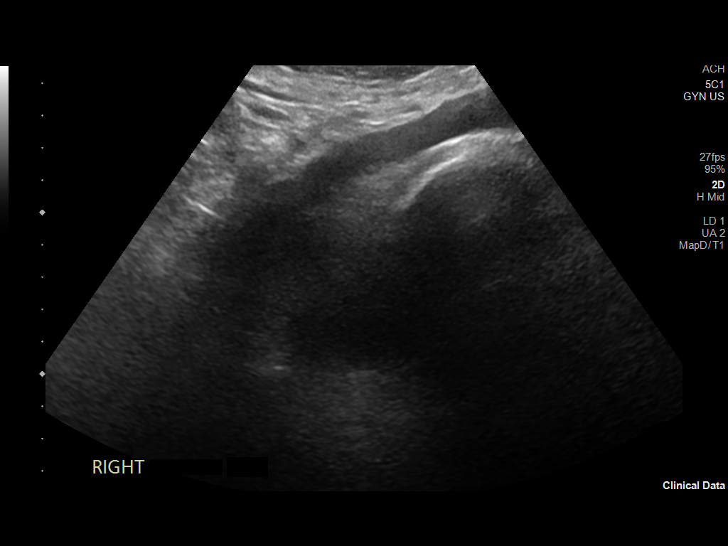
[im 30/90]
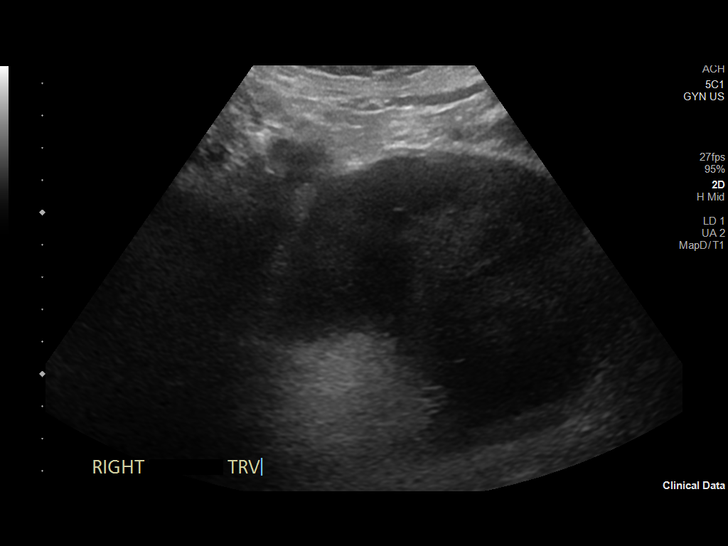
[im 38/90]
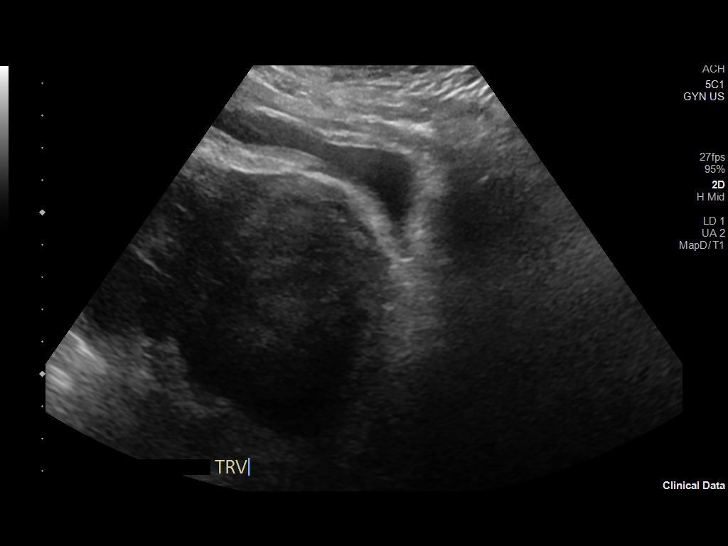
[im 45/90]
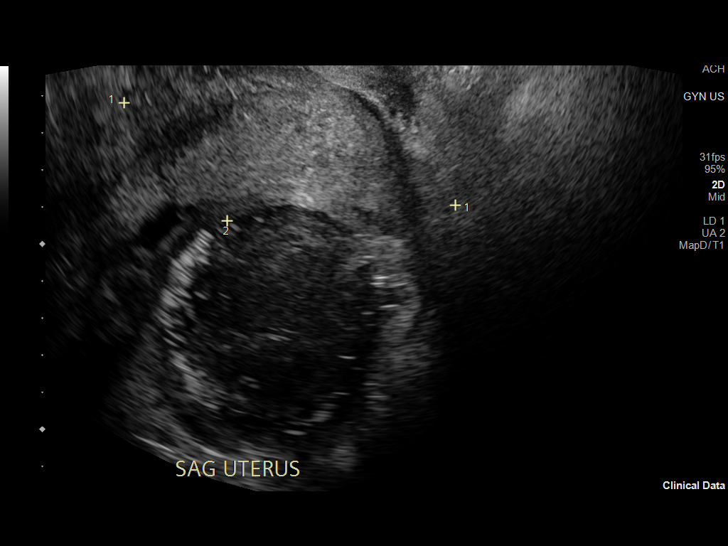
[im 52/90]
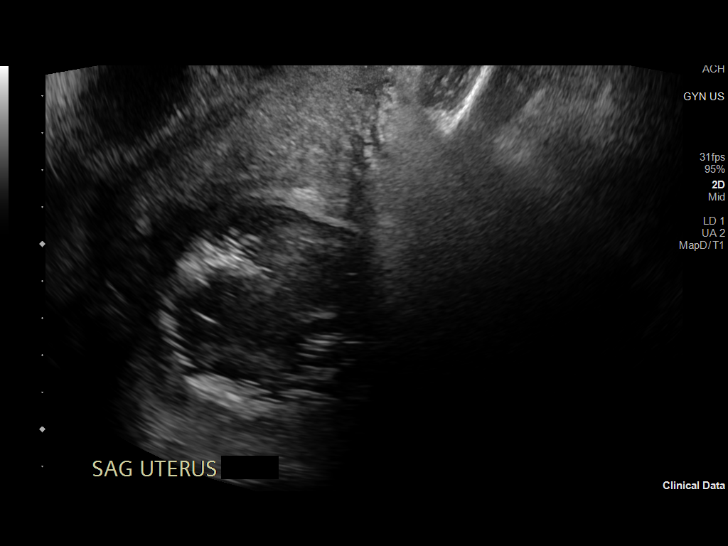
[im 60/90]
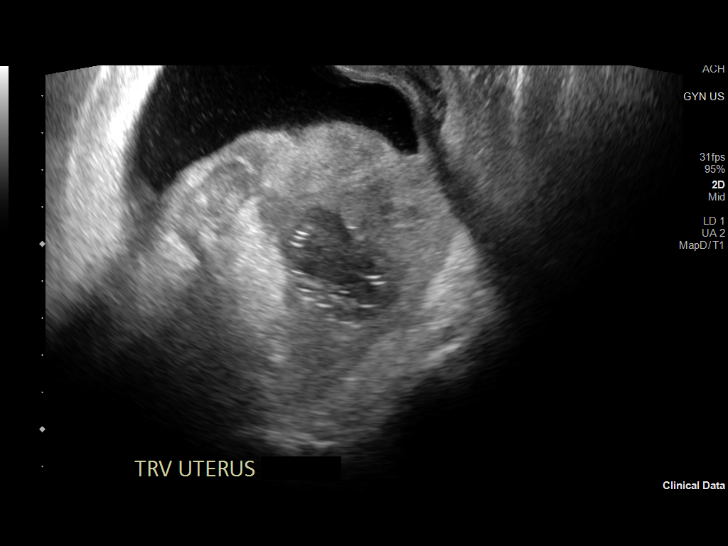
[im 67/90]
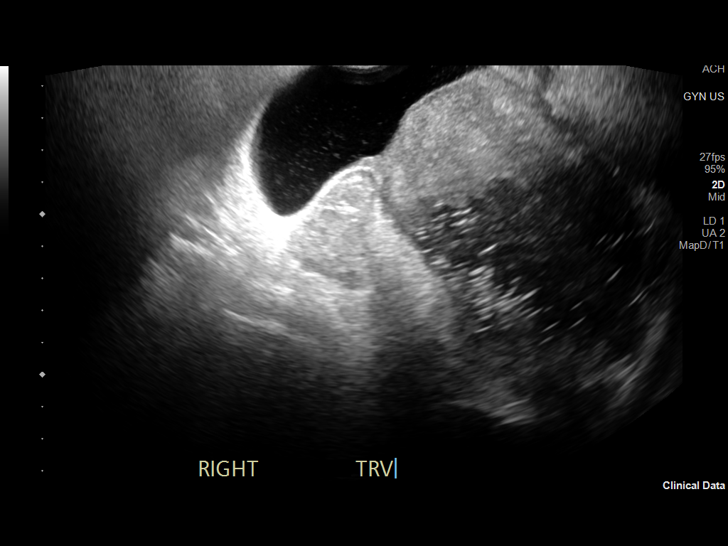
[im 75/90]
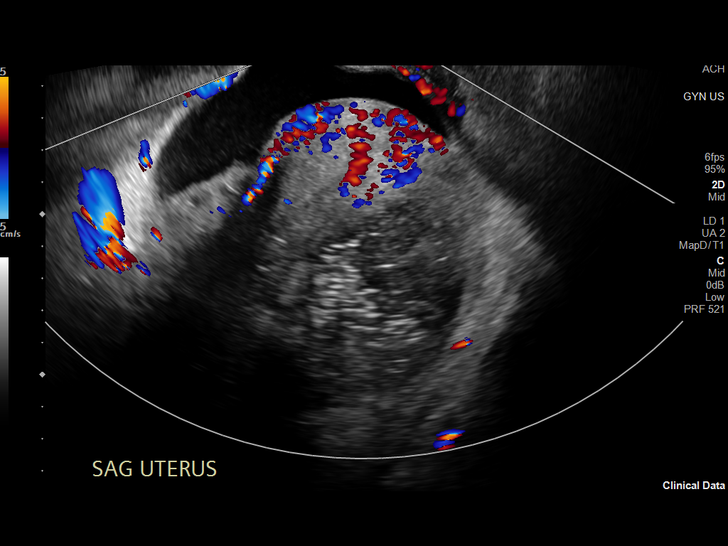
[im 82/90]
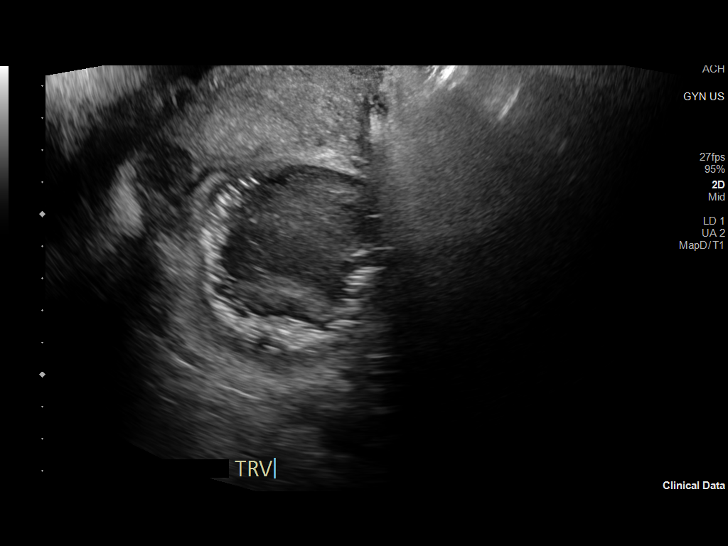
[im 90/90]
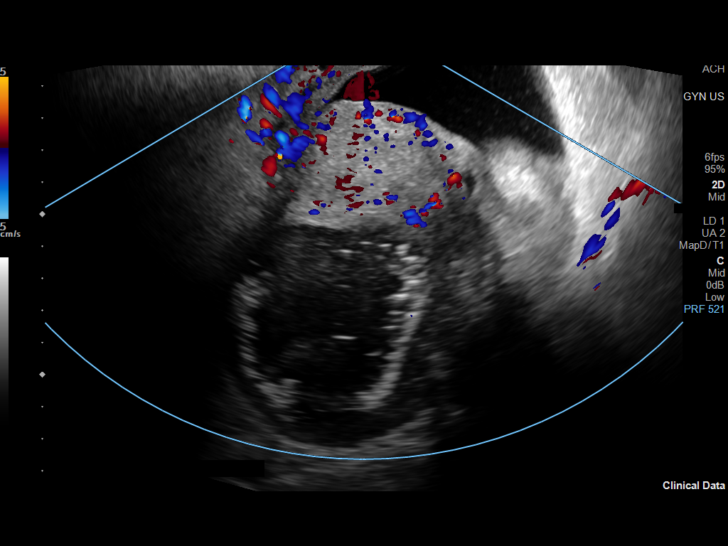

[13 of 25 positions shown; findings below may reference images not displayed]

FINDINGS: Uterus

Measurements: 9.4 x 5.1 x 3.0 cm = volume: 177 mL. No fibroids or
other mass visualized.

Endometrium

Thickness: 4 mm.  No focal abnormality visualized.

Right ovary

Measurements: 10.0 x 7.2 x 6.6 cm = volume: 250 mL. The right ovary
is grossly enlarged by a complex cyst or mass, with heterogeneous
internal echogenicity. There is no visualized color Doppler flow the
right ovary.

Left ovary

The left ovary is nonvisualized.

Other: Small volume right adnexal free fluid.
IMPRESSION: 1. The right ovary is grossly enlarged by a complex cyst or mass,
heterogeneous internal echogenicity, measuring at least 10.0 cm.
This may reflect a large hemorrhagic cyst or perhaps an ovarian
teratoma. There is no visualized color Doppler flow to the right
ovary, concerning for ovarian torsion. Any ovarian mass may serve as
a nidus of torsion.

2.  The left ovary is nonvisualized.

3.  Small volume nonspecific right adnexal fluid.

These results were called by telephone at the time of interpretation
on 03/29/2020 at [DATE] to Dr. MEFIRE SUZANE , who verbally
acknowledged these results.

## 2021-04-15 IMAGING — DX DG CHEST 1V PORT
1 series · 1 of 1 positions shown · non-contrast
Comparison: None.

CLINICAL DATA: Shortness of breath.  Weakness.

EXAM:
PORTABLE CHEST 1 VIEW

[chest]
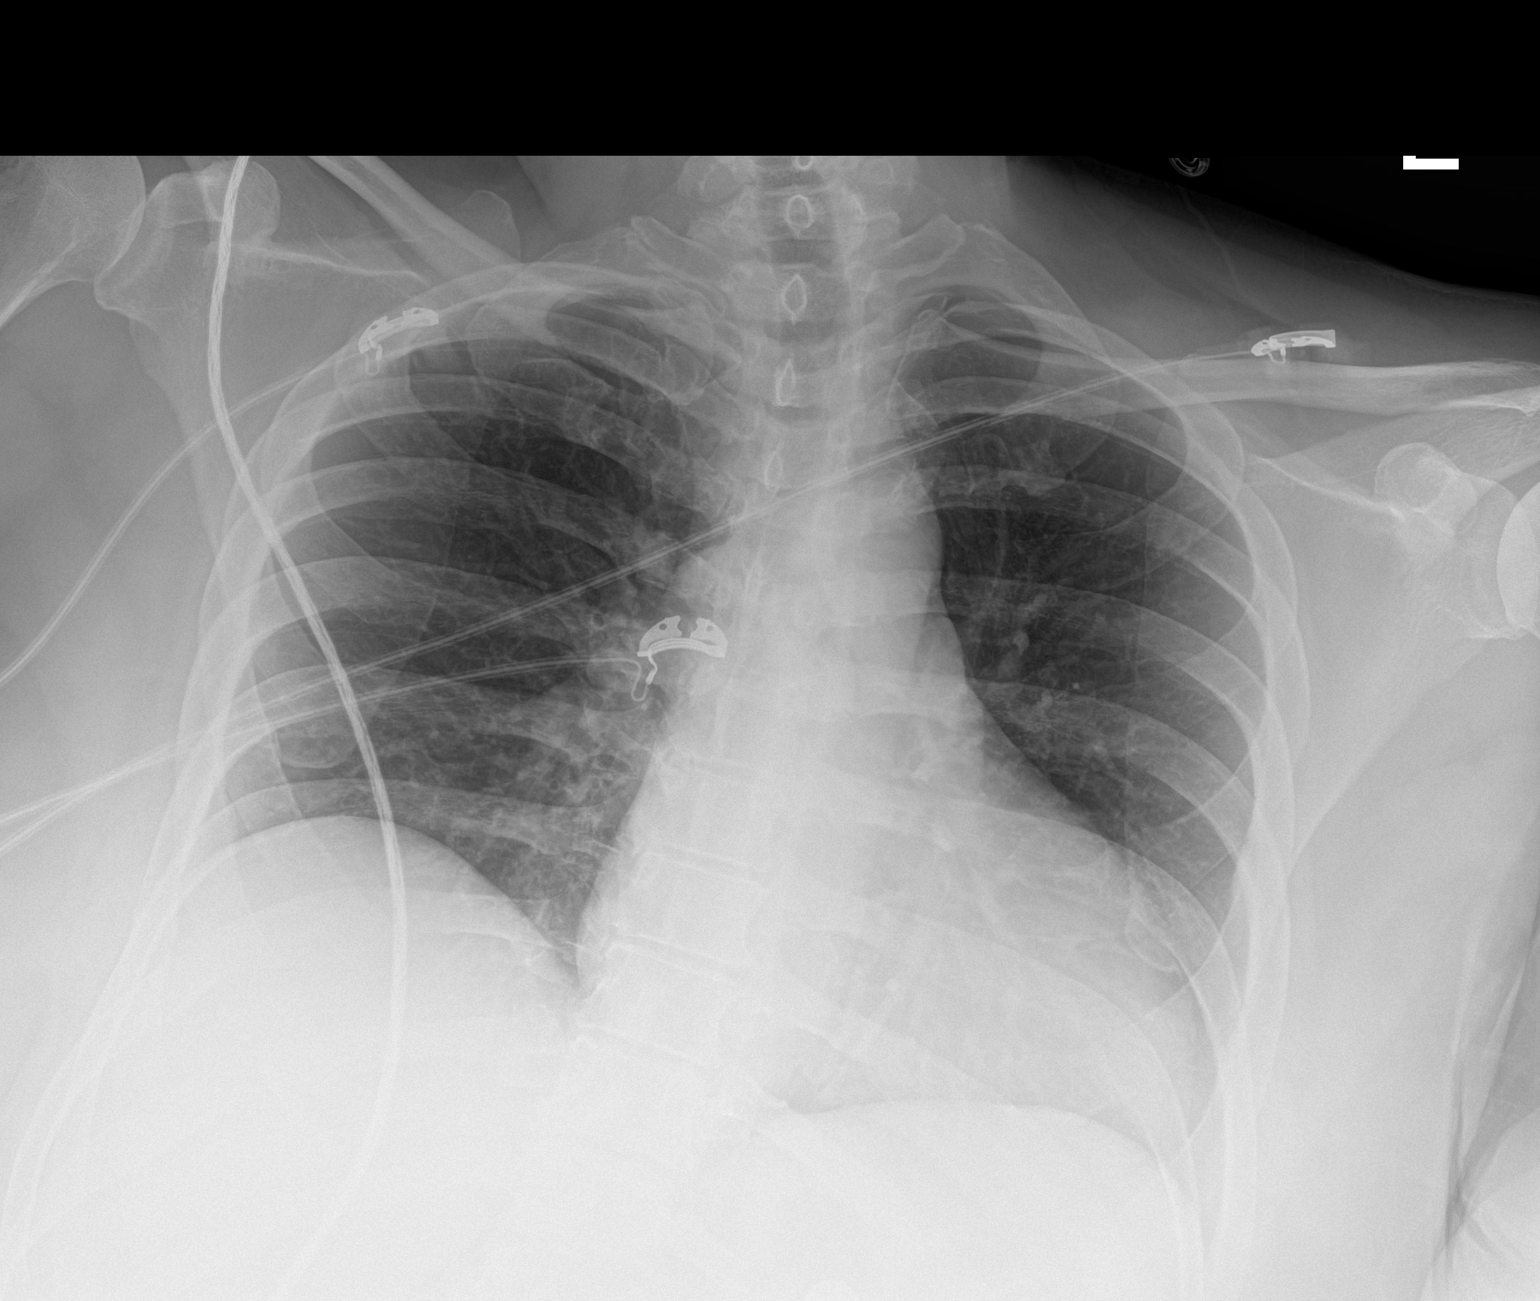

[1 of 1 positions shown; findings below may reference images not displayed]

FINDINGS: Lung volumes are low.The cardiomediastinal contours are normal. The
lungs are clear. Pulmonary vasculature is normal. No consolidation,
pleural effusion, or pneumothorax. No acute osseous abnormalities
are seen. Scoliotic curvature of the spine versus positioning.
IMPRESSION: Low lung volumes without acute abnormality.
